# Patient Record
Sex: Female | Born: 1955
Health system: Southern US, Community
[De-identification: ages and names within clinical notes are randomized; demographics above are authoritative.]

## PROBLEM LIST (undated history)

## (undated) DIAGNOSIS — I208 Other forms of angina pectoris: Secondary | ICD-10-CM

## (undated) DIAGNOSIS — E7800 Pure hypercholesterolemia, unspecified: Secondary | ICD-10-CM

## (undated) DIAGNOSIS — M1990 Unspecified osteoarthritis, unspecified site: Secondary | ICD-10-CM

## (undated) DIAGNOSIS — I10 Essential (primary) hypertension: Secondary | ICD-10-CM

## (undated) DIAGNOSIS — E785 Hyperlipidemia, unspecified: Secondary | ICD-10-CM

## (undated) DIAGNOSIS — D649 Anemia, unspecified: Secondary | ICD-10-CM

## (undated) DIAGNOSIS — I2089 Other forms of angina pectoris: Secondary | ICD-10-CM

## (undated) DIAGNOSIS — M549 Dorsalgia, unspecified: Secondary | ICD-10-CM

## (undated) DIAGNOSIS — M4322 Fusion of spine, cervical region: Secondary | ICD-10-CM

## (undated) DIAGNOSIS — R7301 Impaired fasting glucose: Secondary | ICD-10-CM

## (undated) DIAGNOSIS — G5603 Carpal tunnel syndrome, bilateral upper limbs: Secondary | ICD-10-CM

## (undated) DIAGNOSIS — F419 Anxiety disorder, unspecified: Secondary | ICD-10-CM

## (undated) DIAGNOSIS — G8929 Other chronic pain: Secondary | ICD-10-CM

## (undated) DIAGNOSIS — M79606 Pain in leg, unspecified: Secondary | ICD-10-CM

## (undated) HISTORY — DX: Other chronic pain: G89.29

## (undated) HISTORY — DX: Carpal tunnel syndrome, bilateral upper limbs: G56.03

## (undated) HISTORY — DX: Pain in leg, unspecified: M79.606

## (undated) HISTORY — DX: Other forms of angina pectoris: I20.89

## (undated) HISTORY — DX: Anemia, unspecified: D64.9

## (undated) HISTORY — PX: CHOLECYSTECTOMY: SHX55

## (undated) HISTORY — DX: Hyperlipidemia, unspecified: E78.5

## (undated) HISTORY — DX: Essential (primary) hypertension: I10

## (undated) HISTORY — PX: ABDOMINAL HYSTERECTOMY: SHX81

## (undated) HISTORY — PX: COLONOSCOPY: SHX174

## (undated) HISTORY — DX: Fusion of spine, cervical region: M43.22

## (undated) HISTORY — DX: Impaired fasting glucose: R73.01

## (undated) HISTORY — DX: Pure hypercholesterolemia, unspecified: E78.00

## (undated) HISTORY — PX: CARDIAC CATHETERIZATION: SHX172

## (undated) HISTORY — PX: VEIN SURGERY: SHX48

## (undated) HISTORY — DX: Dorsalgia, unspecified: M54.9

## (undated) HISTORY — DX: Other forms of angina pectoris: I20.8

---

## 2001-06-01 ENCOUNTER — Other Ambulatory Visit: Admission: RE | Admit: 2001-06-01 | Discharge: 2001-06-01 | Payer: Self-pay | Admitting: General Surgery

## 2001-06-18 ENCOUNTER — Encounter: Payer: Self-pay | Admitting: *Deleted

## 2001-06-18 ENCOUNTER — Ambulatory Visit (HOSPITAL_COMMUNITY): Admission: RE | Admit: 2001-06-18 | Discharge: 2001-06-18 | Payer: Self-pay | Admitting: *Deleted

## 2001-06-22 ENCOUNTER — Ambulatory Visit (HOSPITAL_COMMUNITY): Admission: RE | Admit: 2001-06-22 | Discharge: 2001-06-22 | Payer: Self-pay | Admitting: Internal Medicine

## 2001-06-22 ENCOUNTER — Emergency Department (HOSPITAL_COMMUNITY): Admission: EM | Admit: 2001-06-22 | Discharge: 2001-06-22 | Payer: Self-pay | Admitting: Emergency Medicine

## 2001-06-22 ENCOUNTER — Encounter: Payer: Self-pay | Admitting: Emergency Medicine

## 2001-12-02 ENCOUNTER — Ambulatory Visit (HOSPITAL_COMMUNITY): Admission: RE | Admit: 2001-12-02 | Discharge: 2001-12-02 | Payer: Self-pay | Admitting: Pulmonary Disease

## 2002-03-23 ENCOUNTER — Encounter: Payer: Self-pay | Admitting: Obstetrics and Gynecology

## 2002-03-23 ENCOUNTER — Ambulatory Visit (HOSPITAL_COMMUNITY): Admission: RE | Admit: 2002-03-23 | Discharge: 2002-03-23 | Payer: Self-pay | Admitting: Obstetrics and Gynecology

## 2002-07-16 ENCOUNTER — Ambulatory Visit (HOSPITAL_COMMUNITY): Admission: RE | Admit: 2002-07-16 | Discharge: 2002-07-16 | Payer: Self-pay | Admitting: Pulmonary Disease

## 2003-01-24 ENCOUNTER — Ambulatory Visit (HOSPITAL_COMMUNITY): Admission: RE | Admit: 2003-01-24 | Discharge: 2003-01-24 | Payer: Self-pay

## 2003-01-24 ENCOUNTER — Encounter: Payer: Self-pay | Admitting: Obstetrics and Gynecology

## 2004-01-25 ENCOUNTER — Ambulatory Visit (HOSPITAL_COMMUNITY): Admission: RE | Admit: 2004-01-25 | Discharge: 2004-01-25 | Payer: Self-pay | Admitting: Family Medicine

## 2005-02-04 ENCOUNTER — Ambulatory Visit (HOSPITAL_COMMUNITY): Admission: RE | Admit: 2005-02-04 | Discharge: 2005-02-04 | Payer: Self-pay | Admitting: Obstetrics and Gynecology

## 2006-02-06 ENCOUNTER — Ambulatory Visit (HOSPITAL_COMMUNITY): Admission: RE | Admit: 2006-02-06 | Discharge: 2006-02-06 | Payer: Self-pay | Admitting: Obstetrics and Gynecology

## 2007-07-10 ENCOUNTER — Ambulatory Visit (HOSPITAL_COMMUNITY): Admission: RE | Admit: 2007-07-10 | Discharge: 2007-07-10 | Payer: Self-pay | Admitting: Obstetrics & Gynecology

## 2007-08-03 ENCOUNTER — Ambulatory Visit (HOSPITAL_COMMUNITY): Admission: RE | Admit: 2007-08-03 | Discharge: 2007-08-03 | Payer: Self-pay | Admitting: Family Medicine

## 2007-09-07 ENCOUNTER — Ambulatory Visit (HOSPITAL_COMMUNITY): Admission: RE | Admit: 2007-09-07 | Discharge: 2007-09-07 | Payer: Self-pay | Admitting: Gastroenterology

## 2007-09-07 ENCOUNTER — Encounter: Payer: Self-pay | Admitting: Gastroenterology

## 2007-09-07 ENCOUNTER — Ambulatory Visit: Payer: Self-pay | Admitting: Gastroenterology

## 2007-12-30 ENCOUNTER — Ambulatory Visit (HOSPITAL_COMMUNITY): Admission: RE | Admit: 2007-12-30 | Discharge: 2007-12-30 | Payer: Self-pay | Admitting: Obstetrics and Gynecology

## 2008-08-03 ENCOUNTER — Ambulatory Visit (HOSPITAL_COMMUNITY): Admission: RE | Admit: 2008-08-03 | Discharge: 2008-08-03 | Payer: Self-pay | Admitting: Obstetrics and Gynecology

## 2009-02-21 ENCOUNTER — Encounter (INDEPENDENT_AMBULATORY_CARE_PROVIDER_SITE_OTHER): Payer: Self-pay | Admitting: *Deleted

## 2009-03-20 DIAGNOSIS — I10 Essential (primary) hypertension: Secondary | ICD-10-CM | POA: Insufficient documentation

## 2009-03-20 DIAGNOSIS — Z9189 Other specified personal risk factors, not elsewhere classified: Secondary | ICD-10-CM | POA: Insufficient documentation

## 2009-03-21 ENCOUNTER — Ambulatory Visit: Payer: Self-pay | Admitting: Gastroenterology

## 2009-03-21 DIAGNOSIS — K3189 Other diseases of stomach and duodenum: Secondary | ICD-10-CM | POA: Insufficient documentation

## 2009-03-21 DIAGNOSIS — R1013 Epigastric pain: Secondary | ICD-10-CM

## 2009-03-22 ENCOUNTER — Encounter: Payer: Self-pay | Admitting: Gastroenterology

## 2009-03-23 ENCOUNTER — Encounter: Payer: Self-pay | Admitting: Gastroenterology

## 2009-04-11 ENCOUNTER — Encounter: Payer: Self-pay | Admitting: Gastroenterology

## 2009-04-11 ENCOUNTER — Ambulatory Visit: Payer: Self-pay | Admitting: Gastroenterology

## 2009-04-11 ENCOUNTER — Ambulatory Visit (HOSPITAL_COMMUNITY): Admission: RE | Admit: 2009-04-11 | Discharge: 2009-04-11 | Payer: Self-pay | Admitting: Gastroenterology

## 2009-04-13 ENCOUNTER — Ambulatory Visit: Payer: Self-pay | Admitting: Gastroenterology

## 2009-04-20 ENCOUNTER — Encounter: Payer: Self-pay | Admitting: Gastroenterology

## 2009-04-28 ENCOUNTER — Encounter: Payer: Self-pay | Admitting: Gastroenterology

## 2009-05-15 ENCOUNTER — Ambulatory Visit (HOSPITAL_COMMUNITY): Admission: RE | Admit: 2009-05-15 | Discharge: 2009-05-15 | Payer: Self-pay | Admitting: Gastroenterology

## 2009-07-12 ENCOUNTER — Ambulatory Visit: Payer: Self-pay | Admitting: Gastroenterology

## 2009-07-12 DIAGNOSIS — K2970 Gastritis, unspecified, without bleeding: Secondary | ICD-10-CM | POA: Insufficient documentation

## 2009-09-04 ENCOUNTER — Ambulatory Visit (HOSPITAL_COMMUNITY): Admission: RE | Admit: 2009-09-04 | Discharge: 2009-09-04 | Payer: Self-pay | Admitting: Family Medicine

## 2009-10-09 ENCOUNTER — Ambulatory Visit (HOSPITAL_COMMUNITY): Admission: RE | Admit: 2009-10-09 | Discharge: 2009-10-09 | Payer: Self-pay | Admitting: Obstetrics & Gynecology

## 2010-09-05 ENCOUNTER — Ambulatory Visit (HOSPITAL_COMMUNITY): Admission: RE | Admit: 2010-09-05 | Discharge: 2010-09-05 | Payer: Self-pay | Admitting: Obstetrics & Gynecology

## 2010-12-18 NOTE — Assessment & Plan Note (Signed)
Summary: PERSISTENT GASTRITIS/CM   Visit Type:  Follow-up Visit Primary Care Provider:  Loran Senters, M.D.  Chief Complaint:  persistent gastritis.  History of Present Illness: Choking mostly when she hasn't eaten anything. sx come and go. Feels full fast, and bloating. Sx worse firsth thing in the AM. Not a lot of heartburn. Sometimes indigestion. In the AM feels nauseous and no vomiting. Discomfort in chest and epigastrium. No radiation. Notices it when laying down and when she wakes up, <1x/wk. No weight loss, fever, or chills. Has hot flashes. BMs: 1-2x/day, nl ~diarrhea. Triggers: no. Cut out fats and feels better. Weight gain: 15 lbs. No blood in stool or tarry stools. Stopped Melxoicam and Nabumetone, but taking for heel spur. Sx did not changed after Abx. Discomfort since last Nov 2009. Protonix hasn't helped. No worse with wine.  Preventive Screening-Counseling & Management     Smoking Status: quit  Current Medications (verified): 1)  Moexpril/hctz 15/25 Mg .... Take 1 Tablet By Mouth Once A Day 2)  Protonix 40 Mg Tbec (Pantoprazole Sodium) .... Take 1 Tablet By Mouth Once A Day 3)  Citalopram Hydrobromide 20 Mg Tabs (Citalopram Hydrobromide) .... Take 1 Tablet By Mouth Once A Day  Allergies (verified): No Known Drug Allergies  Past History:  Past Medical History:    Hypertension    Hot flashes treated with Citaolpram    Hyperplastic polyps 2008  Past Surgical History:    Hysterectomy     Cholecystectomy: removed due to nausea    Cardiac catheterization  Family History:    No FH of Colon Cancer or polyps.  Social History:    Patient is a former smoker in her 64s.    Alcohol Use - yes, wine of weekends    Occupation: Works in [employer]    Daily Caffeine Use: chocolate, no sodas    Diet is low fat.    Smoking Status:  quit  Review of Systems       Sometimes SOB with episodes during the day. No PND, and can lay flat. ROS per HPI , otherwise all  systems negative.  Vital Signs:  Patient profile:   55 year old female Height:      63 inches Weight:      176.50 pounds BMI:     31.38 Temp:     98.7 degrees F oral Pulse rate:   64 / minute BP sitting:   130 / 78  (left arm) Cuff size:   regular  Vitals Entered By: Cloria Spring LPN (Mar 21, 1609 2:17 PM)  Physical Exam  General:  Well developed, well nourished, no acute distress. Head:  Normocephalic and atraumatic. Eyes:  PERRLA, no icterus. Mouth:  No deformity or lesions, dentition normal. Neck:  Supple; no masses lymphadenopathy. Lungs:  Clear throughout to auscultation. Heart:  Regular rate and rhythm; no murmurs, rubs,  or bruits. Abdomen:  Soft, nontender and nondistended. No masses, hepatosplenomegaly or hernias noted. Normal bowel sounds. Msk:  Symmetrical with no gross deformities. Normal posture. Extremities:  No edema or deformities noted. Neurologic:  Alert and  oriented x4;  grossly normal neurologically.  Impression & Recommendations:  Problem # 1:  DYSPEPSIA (ICD-536.8) Assessment New Likely secondary to GERD:  Differential includes NSAID gastritis, non-ulcer dyspepsia, doubt biliary colic or occult pancreatic malignancy. Pt has gained weight and is on a high fat diet. She loves [employer]. Omeprazole two times a day 30 minutes prior to meals. Low fat diet. HO given. Lose  10-20 lbs.  EGD in 3 weeks, UE:[id number]. If Sx not improved & no esophagitis or gastritis,  then will place a Bravo-48hrs on PPI two times a day.  RPV in 2 mos. Follow [facility] recs. Consider HFP/Lipase and CT A/P. Hold ASA. May use Nabumetone sparingly.  CC: Daisy Edwards, M.D. Prescriptions: EQ OMEPRAZOLE 20 MG TBEC (OMEPRAZOLE) 1po 30 minutes prior to breakfast and supper  #60 x 5   Entered and Authorized by:   Jacques Navy MD   Signed by:   Jacques Navy MD on 03/21/2009   Method used:   Electronically to        [facility] (retail)       [address]        [place], [place]  [address]       Ph: [phone]       Fax: [phone]   RxID:   [id number]   Appended Document: Orders Update-charge    Clinical Lists Changes  Orders: Added new Service order of Est. Patient Level V ([address]) - Signed

## 2010-12-18 NOTE — Letter (Signed)
Summary: PA & LAT CXR order  PA & LAT CXR order   Imported By: Minna Merritts 04/28/2009 16:31:52  _____________________________________________________________________  External Attachment:    Type:   Image     Comment:   External Document

## 2010-12-18 NOTE — Assessment & Plan Note (Signed)
Summary: fu ov 2 mo from procedure,dyspepsia/ams   Visit Type:  Follow-up Visit Primary Care Provider:  Lala Lund, M.D.  Chief Complaint:  follow up- doing good.  History of Present Illness: Pt doing well. Having CT and heel spur. Still has nabumetone and meloxicam and was taking them 3-4 times a week.   Current Medications (verified): 1)  Moexpril/hctz 15/25 Mg .... Take 1 Tablet By Mouth Once A Day 2)  Protonix 40 Mg Tbec (Pantoprazole Sodium) .... Take 1 Tablet By Mouth Once A Day 3)  Citalopram Hydrobromide 20 Mg Tabs (Citalopram Hydrobromide) .... Take 1 Tablet By Mouth Once A Day 4)  Eq Omeprazole 20 Mg Tbec (Omeprazole) .Marland Kitchen.. 1po 30 Minutes Prior To Breakfast and Supper  Allergies (verified): No Known Drug Allergies  Past History:  Past Medical History: EGD/NSAID Gastritis JUNE 2010 Hypertension Hot flashes treated with Citaolpram Hyperplastic polyps 2008  Vital Signs:  Patient profile:   55 year old female Height:      63 inches Weight:      178 pounds BMI:     31.65 Temp:     98.0 degrees F oral Pulse rate:   72 / minute BP sitting:   140 / 92  (left arm) Cuff size:   regular  Vitals Entered By: Hendricks Limes LPN (July 12, 2009 10:51 AM)  Physical Exam  General:  Well developed, well nourished, no acute distress. Head:  Normocephalic and atraumatic. Mouth:  No deformity or lesions, dentition normal. Lungs:  Clear throughout to auscultation. Heart:  Regular rate and rhythm; no murmurs, rubs,  or bruits. Abdomen:  Soft, nontender and nondistended. Normal bowel sounds.  Impression & Recommendations:  Problem # 1:  GASTRITIS, ACUTE W/O HEMORRHAGE (ICD-535.00) Ok to take Nabumetone. Avoid using Meloxicam and Nabumetone together. Explained risk to GI and GU tract. Continue omeprazole two times a day. OPV in 4 mos. May try reducing omperazole to once a day after next visit.  CC: Dr. Lala Lund  Appended Document: Orders Update-charge    Clinical Lists  Changes  Orders: Added new Service order of Est. Patient Level II ([address]) - Signed

## 2010-12-18 NOTE — Letter (Signed)
Summary: EGD Order  EGD Order   Imported By: Elinor Parkinson 03/22/2009 12:21:59  _____________________________________________________________________  External Attachment:    Type:   Image     Comment:   External Document

## 2010-12-18 NOTE — Medication Information (Signed)
Summary: omeprazole PA  omeprazole PA   Imported By: Hendricks Limes LPN 16/08/9603 54:09:81  _____________________________________________________________________  External Attachment:    Type:   Image     Comment:   External Document

## 2010-12-18 NOTE — Letter (Signed)
Summary: Generic Letter, Intro to Referring  [facility]  [address]   [place], [place] [address]   Phone: [phone]  Fax: [phone]      February 21, 2009             RE: Daisy Edwards   10-22-1956                 [address]                 [place], [place]  [address]  Dear, Appointment Secretary   Patient is scheduled on 03/21/09 at 2:00pm with Dr. Cira Servant.            Sincerely,    Ave Filter  [facility] Ph: [phone]   Fax: [phone]

## 2011-04-02 NOTE — Op Note (Signed)
NAME:  Daisy Edwards, Daisy Edwards               ACCOUNT NO.:  [id number]   MEDICAL RECORD NO.:  [id number]          PATIENT TYPE:  AMB   LOCATION:  DAY                           FACILITY:  [facility]   PHYSICIAN:  Kassie Mends, M.D.      DATE OF BIRTH:  01/04/1956   DATE OF PROCEDURE:  04/11/2009  DATE OF DISCHARGE:                               OPERATIVE REPORT   PROCEDURE:  Esophagogastroduodenoscopy with cold forceps biopsy and  Bravo capsule placement.   INDICATION FOR EXAM:  Ms. Nappi is a 55 year old female, who presents  with new-onset dyspepsia.  Dyspepsia did not respond to Protonix once  daily or omeprazole twice a day.  Her symptoms persist.  She does drink  wine on the weekend.  She was using meloxicam and nabumetone and now she  is using it sparingly.   FINDINGS:  1. Normal esophagus without evidence of Barrett mass, erosions, or      ulcerations.  2. Patchy erythema in the antrum without evidence of erosion or      ulceration.  Biopsies obtained via cold forceps to evaluate for H.      pylori gastritis in etiology for her dyspepsia.  3. A 1-2 cm hiatal hernia.  4. Normal duodenal bulb and second portion of the duodenum with      moderate bile staining.   DIAGNOSES:  1. Small hiatal hernia.  2. Mild gastritis, no source for dyspepsia identified.   RECOMMENDATIONS:  1. She will have 48-hour Bravo study on omeprazole twice daily.  2. Will await the results of her biopsies.  If neither evaluation      reveals an etiology for her dyspepsia, she will need a hepatic      function panel, lipase, and CT scan of the abdomen and pelvis.  3. No aspirin or NSAIDs or anticoagulation for 5 days.  She is given      information on gastritis.  She should follow a high-fiber diet.  4. She already has a followup appointment to see me in 2 months.   MEDICATIONS:  1. Demerol 50 mg IV.  2. Versed 4 mg IV.   PROCEDURE TECHNIQUE:  Physical exam was performed.  Informed consent was  obtained.  The  patient was explained the benefits, risks, and  alternatives to the procedure.  The patient was connected to monitor and  placed in left lateral position.  Continuous oxygen was provided by  nasal cannula.  IV medicine administered through an indwelling cannula.  After administration of sedation, the patient's esophagus was intubated.  The scope was advanced under direct visualization to the second portion  of the duodenum.  The scope was removed slowly by carefully examining  color, texture, anatomy, and integrity of mucosa on the way out.  Prior  to withdrawal, the scope at the GE junction was identified at 35 cm from  the teeth.  The initial Bravo capsule was introduced to 29 cm from the  teeth.  Suction was applied for 1 minute.  The patient's esophagus was  intubated with a diagnostic gastroscope and visualization of the distal  esophagus revealed the capsule had not attached.  The capsule was  retrieved via [employer].  A second Bravo capsule was deployed at 29 cm  from the teeth.  Suction was applied for 2 minutes.  The patient's  esophagus was intubated with a diagnostic gastroscope and the scope was  advanced to the distal esophagus where confirmation of placement on the  sidewall of the esophagus was achieved.  The scope and the introducer  were removed.  The patient was recovered in endoscopy and discharged  home in satisfactory condition.   PATH:  Gastritis      Kassie Mends, M.D.  Electronically Signed     SM/MEDQ  D:  04/11/2009  T:  04/12/2009  Job:  [id number]   cc:   Donna Bernard, M.D.  Fax: [phone]

## 2011-04-02 NOTE — Op Note (Signed)
NAME:  Daisy Edwards, Daisy Edwards               ACCOUNT NO.:  [id number]   MEDICAL RECORD NO.:  [id number]          PATIENT TYPE:  AMB   LOCATION:  DAY                           FACILITY:  [facility]   PHYSICIAN:  Kassie Mends, M.D.      DATE OF BIRTH:  1956/06/01   DATE OF PROCEDURE:  09/07/2007  DATE OF DISCHARGE:                               OPERATIVE REPORT   PROCEDURE:  Colonoscopy with cold forceps polypectomy.   REFERRING Pearl Bents:  Cyril Mourning, NP   INDICATION FOR EXAM:  Daisy Edwards is a 55 year old female who presents for  average risk colon cancer screening.   FINDINGS:  1. Two 3-5 mm sessile rectal polyps removed via cold forceps.      Otherwise rare sigmoid diverticulosis.  2. No masses, inflammatory changes or AVMs seen.  3. Normal retroflexed view of the rectum.   RECOMMENDATIONS:  1. Will call Daisy Edwards with results of her polypectomy.  If her polyp      is adenomatous, then she should have screening colonoscopy in 5      years.  Her first-degree relatives should have screening      colonoscopy age 59 and then every 5 years.  2. She should follow high fiber diet.  She is given handout on high-      fiber diet polyps and diverticulosis.  3. No aspirin or anti-inflammatory drugs for five days.  No      anticoagulation for five days.   MEDICATIONS:  1. Demerol 75 mg IV.  2. Versed 6 mg IV.   PROCEDURE TECHNIQUE:  Physical exam was performed.  Informed consent was  obtained from the patient after explaining benefits, risks and  alternatives to the procedure.  The patient was connected to monitor and  placed in left lateral position.  Continuous oxygen was provided by  nasal cannula and IV medicine administered through an indwelling  cannula.  After administration of sedation and rectal exam, the  patient's rectum was intubated and the  scope was advanced under direct visualization to the cecum.  The scope  was removed slowly by carefully examining the color, texture, anatomy  and integrity of the mucosa on the way out.  The patient was recovered  in endoscopy and discharged home in satisfactory condition.      Kassie Mends, M.D.  Electronically Signed     SM/MEDQ  D:  09/07/2007  T:  09/08/2007  Job:  [id number]   cc:   Lazaro Arms, M.D.  Fax: [phone]

## 2011-04-02 NOTE — Procedures (Signed)
NAME:  Daisy Edwards, Daisy Edwards               ACCOUNT NO.:  [id number]   MEDICAL RECORD NO.:  [id number]          PATIENT TYPE:  OUT   LOCATION:  DFTL                          FACILITY:  [facility]   PHYSICIAN:  Donna Bernard, M.D.DATE OF BIRTH:  02-11-1956   DATE OF PROCEDURE:  08/03/2007  DATE OF DISCHARGE:  08/03/2007                                  STRESS TEST   INDICATION:  This patient is a 55 year old female with a history of  hypertension and some atypical symptoms in the chest.  The test is being  performed for stratification of risk of cardiovascular disease.   Stress test was performed at standard Bruce protocol.  Resting EKG  revealed normal sinus rhythm with no significant ST-T changes.  The  patient tolerated the first two stages well.  Her sub max predicted  heart rate was 145.  The patient achieved this rate at a minute and a  half into the third stage.  At this point at 0.08 seconds past the J-  point, the patient had minimal ST-segment depression with a sharply  ascending slope.  Reading of this was somewhat compromised by artifact.  It should be noted, though, that in the rest phase we received a good  EKG which showed no significant ST-segment changes.   IMPRESSION:  Negative adequate stress test.   PLAN:  Patient encouraged to exercise regularly, stick with medications  and follow up in office as needed for further concerns.      Donna Bernard, M.D.  Electronically Signed     WSL/MEDQ  D:  08/31/2007  T:  09/01/2007  Job:  [id number]

## 2011-04-05 NOTE — Op Note (Signed)
NAME:  Daisy Edwards, Daisy Edwards               ACCOUNT NO.:  [id number]   MEDICAL RECORD NO.:  [id number]          PATIENT TYPE:  AMB   LOCATION:  DAY                           FACILITY:  [facility]   PHYSICIAN:  Kassie Mends, M.D.      DATE OF BIRTH:  14-Feb-1956   DATE OF PROCEDURE:  04/13/2009  DATE OF DISCHARGE:  04/11/2009                               OPERATIVE REPORT   PROCEDURE:  48-hour Bravo capsule study.   REFERRING Tymothy Cass:  Donna Bernard, M.D.   INDICATION FOR EXAM:  [name] is a 55 year old female who presented  with choking when she has not eaten anything.  She had early satiety and  bloating.  Tums was not helping.  She had an upper endoscopy which  showed a normal esophagus and some patchy erythema.  Her biopsy showed  moderate gastritis without evidence of H pylori.  Her gastritis is  likely secondary to meloxicam and nabumetone.  A Bravo study was placed  to evaluate for uncontrolled gastroesophageal reflux disease on  omeprazole twice a day.   FINDINGS:  The patient had a 48-hour study.  Day 1, the evaluation was  22 hours and 33 minutes.  Day 2, the evaluation was 23 hours and 7  minutes.  She had DeMeester score on day 1 of 0.3 and a DeMeester score  on day 2 of 1.8.  She had 104 episodes of reflux.  Five lasted greater  than 5 minutes.  The duration of the longest reflux was 16 minutes.  She  spent 4.9 minutes at a pH less than 4 in the 48-hour period.  Her  symptom association probability was 60.4% heartburn, 7.2% meal, 30.2%  chest pain, and 99.1% other.  A symptom association probability of  greater than 90% indicates the probability that the observed association  occurred by chance is less than 5%.  She submitted a diary and on day 1  she had episodes of chest pain, 1 episode of chest pain at 2:30 p.m., 1  episode of chest pain at 11:30 p.m., 1 episode of symptoms at 5 o'clock  p.m., and 1 episode which occurred during sleep.  On day 2, she had 2  episodes  associated with heartburn, 2 episodes associated with chest  pain, 3 episodes associated with meal, and 1episode associated with  sleep.   The fraction of time that the pH was less than 4 was also measured in  the upright position at 7.93 minutes for 48 hours and 1.4 minutes in the  supine position for 48 hours.   DIAGNOSIS:  [name] had several episodes of reflux during the 48-hour  period of time.  It does not appear that she has acid reflux.  She  likely has nonacid reflux.   RECOMMENDATIONS:  She should consider the initiation of baclofen or  continued BID PPI/diet modification. She has no clinical indication for  a Nissen fundoplication.      Kassie Mends, M.D.  Electronically Signed     SM/MEDQ  D:  04/19/2009  T:  04/19/2009  Job:  [id number]   cc:  Margaretmary Eddy, M.D.  Fax: [phone]

## 2011-04-18 ENCOUNTER — Ambulatory Visit (HOSPITAL_COMMUNITY)
Admission: RE | Admit: 2011-04-18 | Discharge: 2011-04-18 | Disposition: A | Discharge status: Home / Self Care | Payer: BC Managed Care – PPO | Source: Ambulatory Visit | Attending: Family Medicine | Admitting: Family Medicine

## 2011-04-18 ENCOUNTER — Other Ambulatory Visit: Payer: Self-pay | Admitting: Family Medicine

## 2011-04-18 DIAGNOSIS — M25519 Pain in unspecified shoulder: Secondary | ICD-10-CM | POA: Insufficient documentation

## 2011-04-18 DIAGNOSIS — R52 Pain, unspecified: Secondary | ICD-10-CM

## 2012-01-03 ENCOUNTER — Other Ambulatory Visit: Payer: Self-pay | Admitting: Obstetrics and Gynecology

## 2012-01-03 DIAGNOSIS — Z139 Encounter for screening, unspecified: Secondary | ICD-10-CM

## 2012-01-09 ENCOUNTER — Ambulatory Visit (HOSPITAL_COMMUNITY)
Admission: RE | Admit: 2012-01-09 | Discharge: 2012-01-09 | Disposition: A | Discharge status: Home / Self Care | Payer: Managed Care, Other (non HMO) | Source: Ambulatory Visit | Attending: Obstetrics and Gynecology | Admitting: Obstetrics and Gynecology

## 2012-01-09 DIAGNOSIS — Z139 Encounter for screening, unspecified: Secondary | ICD-10-CM

## 2012-01-09 DIAGNOSIS — Z1231 Encounter for screening mammogram for malignant neoplasm of breast: Secondary | ICD-10-CM | POA: Insufficient documentation

## 2012-01-23 ENCOUNTER — Encounter (HOSPITAL_COMMUNITY): Payer: Self-pay

## 2012-01-24 ENCOUNTER — Other Ambulatory Visit: Payer: Self-pay

## 2012-01-24 ENCOUNTER — Ambulatory Visit (HOSPITAL_COMMUNITY)
Admission: RE | Admit: 2012-01-24 | Discharge: 2012-01-24 | Disposition: A | Discharge status: Home / Self Care | Payer: Managed Care, Other (non HMO) | Source: Ambulatory Visit | Attending: Orthopaedic Surgery | Admitting: Orthopaedic Surgery

## 2012-01-24 ENCOUNTER — Encounter (HOSPITAL_COMMUNITY): Payer: Self-pay

## 2012-01-24 ENCOUNTER — Encounter (HOSPITAL_COMMUNITY)
Admission: RE | Admit: 2012-01-24 | Discharge: 2012-01-24 | Disposition: A | Discharge status: Home / Self Care | Payer: Managed Care, Other (non HMO) | Source: Ambulatory Visit | Attending: Orthopaedic Surgery | Admitting: Orthopaedic Surgery

## 2012-01-24 ENCOUNTER — Other Ambulatory Visit (HOSPITAL_COMMUNITY): Payer: Self-pay | Admitting: Orthopaedic Surgery

## 2012-01-24 DIAGNOSIS — Z01810 Encounter for preprocedural cardiovascular examination: Secondary | ICD-10-CM | POA: Insufficient documentation

## 2012-01-24 DIAGNOSIS — Z01811 Encounter for preprocedural respiratory examination: Secondary | ICD-10-CM | POA: Insufficient documentation

## 2012-01-24 DIAGNOSIS — Z01818 Encounter for other preprocedural examination: Secondary | ICD-10-CM | POA: Insufficient documentation

## 2012-01-24 DIAGNOSIS — Z01812 Encounter for preprocedural laboratory examination: Secondary | ICD-10-CM | POA: Insufficient documentation

## 2012-01-24 HISTORY — DX: Anxiety disorder, unspecified: F41.9

## 2012-01-24 HISTORY — DX: Unspecified osteoarthritis, unspecified site: M19.90

## 2012-01-24 LAB — URINALYSIS, ROUTINE W REFLEX MICROSCOPIC
Bilirubin Urine: NEGATIVE
Glucose, UA: NEGATIVE mg/dL
Hgb urine dipstick: NEGATIVE
Ketones, ur: NEGATIVE mg/dL
Leukocytes, UA: NEGATIVE
Nitrite: NEGATIVE
Protein, ur: NEGATIVE mg/dL
Specific Gravity, Urine: 1.011 (ref 1.005–1.030)
Urobilinogen, UA: 0.2 mg/dL (ref 0.0–1.0)
pH: 6.5 (ref 5.0–8.0)

## 2012-01-24 LAB — COMPREHENSIVE METABOLIC PANEL
ALT: 27 U/L (ref 0–35)
AST: 22 U/L (ref 0–37)
Albumin: 4.2 g/dL (ref 3.5–5.2)
Alkaline Phosphatase: 75 U/L (ref 39–117)
BUN: 12 mg/dL (ref 6–23)
CO2: 30 mEq/L (ref 19–32)
Calcium: 10.1 mg/dL (ref 8.4–10.5)
Chloride: 101 mEq/L (ref 96–112)
Creatinine, Ser: 0.73 mg/dL (ref 0.50–1.10)
GFR calc Af Amer: >90 mL/min (ref >90)
GFR calc non Af Amer: >90 mL/min (ref >90)
Glucose, Bld: 87 mg/dL (ref 70–99)
Potassium: 3.8 mEq/L (ref 3.5–5.1)
Sodium: 140 mEq/L (ref 135–145)
Total Bilirubin: 0.4 mg/dL (ref 0.3–1.2)
Total Protein: 7.3 g/dL (ref 6.0–8.3)

## 2012-01-24 LAB — CBC
HCT: 40.5 % (ref 36.0–46.0)
Hemoglobin: 13.6 g/dL (ref 12.0–15.0)
MCH: 26.7 pg (ref 26.0–34.0)
MCHC: 33.6 g/dL (ref 30.0–36.0)
MCV: 79.6 fL (ref 78.0–100.0)
Platelets: 257 10*3/uL (ref 150–400)
RBC: 5.09 MIL/uL (ref 3.87–5.11)
RDW: 13.1 % (ref 11.5–15.5)
WBC: 5.7 10*3/uL (ref 4.0–10.5)

## 2012-01-24 LAB — PROTIME-INR
INR: 1.05 (ref 0.00–1.49)
Prothrombin Time: 13.9 seconds (ref 11.6–15.2)

## 2012-01-24 LAB — SURGICAL PCR SCREEN
MRSA, PCR: NEGATIVE
Staphylococcus aureus: NEGATIVE

## 2012-01-24 LAB — APTT: aPTT: 29 seconds (ref 24–37)

## 2012-01-24 NOTE — Pre-Procedure Instructions (Signed)
20 AHMARI GARTON  01/24/2012   Your procedure is scheduled on: 01/29/2012  Report to [facility] at 10:30 AM.  Call this number if you have problems the morning of surgery: [phone]   Remember:   Do not eat food:After Midnight.  May have clear liquids: up to 4 Hours before arrival.  Clear liquids include soda, tea, black coffee, apple or grape juice, broth.  Take these medicines the morning of surgery with A SIP OF WATER: NONE   Do not wear jewelry, make-up or nail polish.  Do not wear lotions, powders, or perfumes. You may wear deodorant.  Do not shave 48 hours prior to surgery.  Do not bring valuables to the hospital.  Contacts, dentures or bridgework may not be worn into surgery.  Leave suitcase in the car. After surgery it may be brought to your room.  For patients admitted to the hospital, checkout time is 11:00 AM the day of discharge.   Patients discharged the day of surgery will not be allowed to drive home.  Name and phone number of your driver: with spouse  Special Instructions: CHG Shower Use Special Wash: 1/2 bottle night before surgery and 1/2 bottle morning of surgery.   Please read over the following fact sheets that you were given: Pain Booklet, Coughing and Deep Breathing, MRSA Information and Surgical Site Infection Prevention

## 2012-01-24 NOTE — Progress Notes (Signed)
Call to [facility], request made for EKG done with 2008 stress test., left voicemail at Med. Records

## 2012-01-27 NOTE — Consult Note (Signed)
Anesthesia:  Patient is a 56 year old female scheduled for a C3-4, C4-5 ACDF on 01/29/12.  History includes HTN, anxiety, arthritis, non-smoker.  Reportedly she had a normal cardiac cath over five years ago (she was unsure of where it was done) and a negative exercise stress test on 08/03/07 by Dr. Lilyan Punt.  EKG on 01/24/12 showed SB at 57 bpm.    Preoperative labs and CXR are WNL.  Plan to proceed.

## 2012-01-28 MED ORDER — CEFAZOLIN SODIUM-DEXTROSE 2-3 GM-% IV SOLR
2.0000 g | Freq: Once | INTRAVENOUS | Status: AC
  Administered 2012-01-29: 2 g via INTRAVENOUS
  Filled 2012-01-28: qty 50

## 2012-01-29 ENCOUNTER — Ambulatory Visit (HOSPITAL_COMMUNITY): Payer: Worker's Compensation | Admitting: Vascular Surgery

## 2012-01-29 ENCOUNTER — Encounter (HOSPITAL_COMMUNITY): Payer: Self-pay | Admitting: Vascular Surgery

## 2012-01-29 ENCOUNTER — Encounter (HOSPITAL_COMMUNITY)
Admission: RE | Disposition: A | Discharge status: Home / Self Care | Payer: Self-pay | Source: Ambulatory Visit | Attending: Orthopaedic Surgery

## 2012-01-29 ENCOUNTER — Encounter (HOSPITAL_COMMUNITY): Payer: Self-pay | Admitting: Anesthesiology

## 2012-01-29 ENCOUNTER — Ambulatory Visit (HOSPITAL_COMMUNITY): Payer: Worker's Compensation

## 2012-01-29 ENCOUNTER — Encounter (HOSPITAL_COMMUNITY): Payer: Self-pay | Admitting: Surgery

## 2012-01-29 ENCOUNTER — Ambulatory Visit (HOSPITAL_COMMUNITY)
Admission: RE | Admit: 2012-01-29 | Discharge: 2012-01-30 | Disposition: A | Discharge status: Home / Self Care | Payer: Worker's Compensation | Source: Ambulatory Visit | Attending: Orthopaedic Surgery | Admitting: Orthopaedic Surgery

## 2012-01-29 DIAGNOSIS — IMO0001 Reserved for inherently not codable concepts without codable children: Secondary | ICD-10-CM | POA: Insufficient documentation

## 2012-01-29 DIAGNOSIS — F411 Generalized anxiety disorder: Secondary | ICD-10-CM | POA: Insufficient documentation

## 2012-01-29 DIAGNOSIS — M47812 Spondylosis without myelopathy or radiculopathy, cervical region: Secondary | ICD-10-CM | POA: Insufficient documentation

## 2012-01-29 DIAGNOSIS — IMO0002 Reserved for concepts with insufficient information to code with codable children: Secondary | ICD-10-CM | POA: Insufficient documentation

## 2012-01-29 DIAGNOSIS — M5020 Other cervical disc displacement, unspecified cervical region: Secondary | ICD-10-CM | POA: Insufficient documentation

## 2012-01-29 DIAGNOSIS — M1990 Unspecified osteoarthritis, unspecified site: Secondary | ICD-10-CM | POA: Insufficient documentation

## 2012-01-29 DIAGNOSIS — I10 Essential (primary) hypertension: Secondary | ICD-10-CM | POA: Insufficient documentation

## 2012-01-29 DIAGNOSIS — M4802 Spinal stenosis, cervical region: Secondary | ICD-10-CM | POA: Diagnosis present

## 2012-01-29 HISTORY — PX: ANTERIOR CERVICAL DECOMP/DISCECTOMY FUSION: SHX1161

## 2012-01-29 SURGERY — ANTERIOR CERVICAL DECOMPRESSION/DISCECTOMY FUSION 2 LEVELS
Anesthesia: General | Site: Neck | Wound class: Clean

## 2012-01-29 MED ORDER — KCL IN DEXTROSE-NACL 20-5-0.45 MEQ/L-%-% IV SOLN
INTRAVENOUS | Status: DC
  Administered 2012-01-29 – 2012-01-30 (×2): via INTRAVENOUS
  Filled 2012-01-29 (×3): qty 1000

## 2012-01-29 MED ORDER — FENTANYL CITRATE 0.05 MG/ML IJ SOLN
INTRAMUSCULAR | Status: DC | PRN
  Administered 2012-01-29 (×3): 50 ug via INTRAVENOUS
  Administered 2012-01-29: 150 ug via INTRAVENOUS

## 2012-01-29 MED ORDER — MIDAZOLAM HCL 5 MG/5ML IJ SOLN
INTRAMUSCULAR | Status: DC | PRN
  Administered 2012-01-29: 2 mg via INTRAVENOUS

## 2012-01-29 MED ORDER — ZOLPIDEM TARTRATE 10 MG PO TABS: 10.0000 mg | ORAL_TABLET | Freq: Every evening | ORAL | Status: DC | PRN

## 2012-01-29 MED ORDER — CEFAZOLIN SODIUM 1-5 GM-% IV SOLN
1.0000 g | Freq: Three times a day (TID) | INTRAVENOUS | Status: AC
  Administered 2012-01-29 – 2012-01-30 (×2): 1 g via INTRAVENOUS
  Filled 2012-01-29 (×3): qty 50

## 2012-01-29 MED ORDER — GLYCOPYRROLATE 0.2 MG/ML IJ SOLN
INTRAMUSCULAR | Status: DC | PRN
  Administered 2012-01-29: 0.2 mg via INTRAVENOUS

## 2012-01-29 MED ORDER — ROCURONIUM BROMIDE 100 MG/10ML IV SOLN
INTRAVENOUS | Status: DC | PRN
  Administered 2012-01-29: 50 mg via INTRAVENOUS

## 2012-01-29 MED ORDER — SIMVASTATIN 10 MG PO TABS
10.0000 mg | ORAL_TABLET | Freq: Every day | ORAL | Status: DC
  Filled 2012-01-29: qty 1

## 2012-01-29 MED ORDER — PROMETHAZINE HCL 25 MG/ML IJ SOLN
INTRAMUSCULAR | Status: AC
  Administered 2012-01-29: 6.25 mg via INTRAVENOUS
  Filled 2012-01-29: qty 1

## 2012-01-29 MED ORDER — SENNOSIDES-DOCUSATE SODIUM 8.6-50 MG PO TABS: 1.0000 | ORAL_TABLET | Freq: Every evening | ORAL | Status: DC | PRN

## 2012-01-29 MED ORDER — PROMETHAZINE HCL 25 MG/ML IJ SOLN
6.2500 mg | Freq: Once | INTRAMUSCULAR | Status: AC
  Administered 2012-01-29: 6.25 mg via INTRAVENOUS

## 2012-01-29 MED ORDER — MOEXIPRIL-HYDROCHLOROTHIAZIDE 15-25 MG PO TABS: 1.0000 | ORAL_TABLET | ORAL | Status: DC

## 2012-01-29 MED ORDER — KETOROLAC TROMETHAMINE 30 MG/ML IJ SOLN
INTRAMUSCULAR | Status: AC
  Filled 2012-01-29: qty 1

## 2012-01-29 MED ORDER — DOCUSATE SODIUM 100 MG PO CAPS
100.0000 mg | ORAL_CAPSULE | Freq: Two times a day (BID) | ORAL | Status: DC
  Administered 2012-01-29 – 2012-01-30 (×2): 100 mg via ORAL
  Filled 2012-01-29 (×3): qty 1

## 2012-01-29 MED ORDER — ACETAMINOPHEN 10 MG/ML IV SOLN
INTRAVENOUS | Status: DC | PRN
  Administered 2012-01-29: 1000 mg via INTRAVENOUS

## 2012-01-29 MED ORDER — LACTATED RINGERS IV SOLN
INTRAVENOUS | Status: DC
  Administered 2012-01-29: 12:00:00 via INTRAVENOUS

## 2012-01-29 MED ORDER — LACTATED RINGERS IV SOLN
INTRAVENOUS | Status: DC | PRN
  Administered 2012-01-29 (×2): via INTRAVENOUS

## 2012-01-29 MED ORDER — NEOSTIGMINE METHYLSULFATE 1 MG/ML IJ SOLN
INTRAMUSCULAR | Status: DC | PRN
  Administered 2012-01-29: 2 mg via INTRAVENOUS

## 2012-01-29 MED ORDER — KCL IN DEXTROSE-NACL 20-5-0.45 MEQ/L-%-% IV SOLN
INTRAVENOUS | Status: AC
  Filled 2012-01-29: qty 1000

## 2012-01-29 MED ORDER — MENTHOL 3 MG MT LOZG: 1.0000 | LOZENGE | OROMUCOSAL | Status: DC | PRN

## 2012-01-29 MED ORDER — LIDOCAINE HCL 4 % MT SOLN
OROMUCOSAL | Status: DC | PRN
  Administered 2012-01-29: 4 mL via TOPICAL

## 2012-01-29 MED ORDER — ACETAMINOPHEN 650 MG RE SUPP: 650.0000 mg | RECTAL | Status: DC | PRN

## 2012-01-29 MED ORDER — BISACODYL 10 MG RE SUPP: 10.0000 mg | Freq: Every day | RECTAL | Status: DC | PRN

## 2012-01-29 MED ORDER — SODIUM CHLORIDE 0.9 % IJ SOLN: 3.0000 mL | INTRAMUSCULAR | Status: DC | PRN

## 2012-01-29 MED ORDER — ONDANSETRON HCL 4 MG/2ML IJ SOLN
4.0000 mg | Freq: Once | INTRAMUSCULAR | Status: AC | PRN
  Administered 2012-01-29: 4 mg via INTRAVENOUS

## 2012-01-29 MED ORDER — DEXAMETHASONE SODIUM PHOSPHATE 4 MG/ML IJ SOLN
INTRAMUSCULAR | Status: DC | PRN
  Administered 2012-01-29: 4 mg via INTRAVENOUS

## 2012-01-29 MED ORDER — THROMBIN 20000 UNITS EX KIT
PACK | CUTANEOUS | Status: DC | PRN
  Administered 2012-01-29: 14:00:00 via TOPICAL

## 2012-01-29 MED ORDER — ADULT MULTIVITAMIN W/MINERALS CH
1.0000 | ORAL_TABLET | Freq: Every day | ORAL | Status: DC
  Filled 2012-01-29: qty 1

## 2012-01-29 MED ORDER — KETOROLAC TROMETHAMINE 30 MG/ML IJ SOLN
30.0000 mg | Freq: Once | INTRAMUSCULAR | Status: AC
  Administered 2012-01-29: 30 mg via INTRAVENOUS

## 2012-01-29 MED ORDER — PROPOFOL 10 MG/ML IV EMUL
INTRAVENOUS | Status: DC | PRN
  Administered 2012-01-29: 50 mg via INTRAVENOUS
  Administered 2012-01-29: 200 mg via INTRAVENOUS

## 2012-01-29 MED ORDER — HYDROMORPHONE HCL PF 1 MG/ML IJ SOLN
0.2500 mg | INTRAMUSCULAR | Status: DC | PRN
  Administered 2012-01-29 (×2): 0.25 mg via INTRAVENOUS

## 2012-01-29 MED ORDER — ARTIFICIAL TEARS OP OINT
TOPICAL_OINTMENT | OPHTHALMIC | Status: DC | PRN
  Administered 2012-01-29: 1 via OPHTHALMIC

## 2012-01-29 MED ORDER — METHOCARBAMOL 500 MG PO TABS: 500.0000 mg | ORAL_TABLET | Freq: Four times a day (QID) | ORAL | Status: DC | PRN

## 2012-01-29 MED ORDER — PHENOL 1.4 % MT LIQD
1.0000 | OROMUCOSAL | Status: DC | PRN
  Filled 2012-01-29: qty 177

## 2012-01-29 MED ORDER — TRANDOLAPRIL 2 MG PO TABS
2.0000 mg | ORAL_TABLET | Freq: Every day | ORAL | Status: DC
  Administered 2012-01-29 – 2012-01-30 (×2): 2 mg via ORAL
  Filled 2012-01-29 (×4): qty 1

## 2012-01-29 MED ORDER — OXYCODONE-ACETAMINOPHEN 5-325 MG PO TABS: 1.0000 | ORAL_TABLET | ORAL | Status: DC | PRN

## 2012-01-29 MED ORDER — MORPHINE SULFATE 4 MG/ML IJ SOLN: 1.0000 mg | INTRAMUSCULAR | Status: DC | PRN

## 2012-01-29 MED ORDER — ONDANSETRON HCL 4 MG/2ML IJ SOLN: 4.0000 mg | INTRAMUSCULAR | Status: DC | PRN

## 2012-01-29 MED ORDER — HYDROCHLOROTHIAZIDE 25 MG PO TABS
25.0000 mg | ORAL_TABLET | Freq: Every day | ORAL | Status: DC
  Administered 2012-01-29 – 2012-01-30 (×2): 25 mg via ORAL
  Filled 2012-01-29 (×2): qty 1

## 2012-01-29 MED ORDER — HYDROCODONE-ACETAMINOPHEN 5-325 MG PO TABS
1.0000 | ORAL_TABLET | ORAL | Status: DC | PRN
  Administered 2012-01-30: 1 via ORAL
  Filled 2012-01-29: qty 1

## 2012-01-29 MED ORDER — ACETAMINOPHEN 325 MG PO TABS: 650.0000 mg | ORAL_TABLET | ORAL | Status: DC | PRN

## 2012-01-29 MED ORDER — SODIUM CHLORIDE 0.9 % IV SOLN: 250.0000 mL | INTRAVENOUS | Status: DC

## 2012-01-29 MED ORDER — SODIUM CHLORIDE 0.9 % IJ SOLN
3.0000 mL | Freq: Two times a day (BID) | INTRAMUSCULAR | Status: DC
  Administered 2012-01-29: 3 mL via INTRAVENOUS

## 2012-01-29 MED ORDER — EPHEDRINE SULFATE 50 MG/ML IJ SOLN
INTRAMUSCULAR | Status: DC | PRN
  Administered 2012-01-29: 10 mg via INTRAVENOUS
  Administered 2012-01-29: 5 mg via INTRAVENOUS
  Administered 2012-01-29: 10 mg via INTRAVENOUS
  Administered 2012-01-29: 5 mg via INTRAVENOUS
  Administered 2012-01-29 (×2): 10 mg via INTRAVENOUS

## 2012-01-29 MED ORDER — 0.9 % SODIUM CHLORIDE (POUR BTL) OPTIME
TOPICAL | Status: DC | PRN
  Administered 2012-01-29: 1000 mL

## 2012-01-29 MED ORDER — ACETAMINOPHEN 10 MG/ML IV SOLN
INTRAVENOUS | Status: AC
  Filled 2012-01-29: qty 100

## 2012-01-29 MED ORDER — ONDANSETRON HCL 4 MG/2ML IJ SOLN
INTRAMUSCULAR | Status: DC | PRN
  Administered 2012-01-29: 4 mg via INTRAVENOUS

## 2012-01-29 MED ORDER — HYDROMORPHONE HCL PF 1 MG/ML IJ SOLN
INTRAMUSCULAR | Status: AC
  Administered 2012-01-29: 0.25 mg via INTRAVENOUS
  Filled 2012-01-29: qty 1

## 2012-01-29 MED ORDER — BUPIVACAINE-EPINEPHRINE 0.25% -1:200000 IJ SOLN
INTRAMUSCULAR | Status: DC | PRN
  Administered 2012-01-29: 6 mL

## 2012-01-29 MED ORDER — METHOCARBAMOL 100 MG/ML IJ SOLN
500.0000 mg | Freq: Four times a day (QID) | INTRAVENOUS | Status: DC | PRN
  Filled 2012-01-29: qty 5

## 2012-01-29 SURGICAL SUPPLY — 53 items
APL SKNCLS STERI-STRIP NONHPOA (GAUZE/BANDAGES/DRESSINGS) ×1
BENZOIN TINCTURE PRP APPL 2/3 (GAUZE/BANDAGES/DRESSINGS) ×3 IMPLANT
BLADE SURG ROTATE 9660 (MISCELLANEOUS) IMPLANT
BUR ROUND FLUTED 4 SOFT TCH (BURR) IMPLANT
CLOSURE STERI-STRIP 1/4X4 (GAUZE/BANDAGES/DRESSINGS) ×1 IMPLANT
CLOTH BEACON ORANGE TIMEOUT ST (SAFETY) ×2 IMPLANT
COLLAR CERV LO CONTOUR FIRM DE (SOFTGOODS) ×2 IMPLANT
COMPOSITE CERV ALLOGRAFT 6MM (Orthopedic Implant) ×1 IMPLANT
COMPOSITE CERV ALLOGRAFT 7MM (Orthopedic Implant) ×2 IMPLANT
CORDS BIPOLAR (ELECTRODE) ×2 IMPLANT
COVER SURGICAL LIGHT HANDLE (MISCELLANEOUS) ×2 IMPLANT
DRAPE C-ARM 42X72 X-RAY (DRAPES) ×2 IMPLANT
DRAPE MICROSCOPE LEICA (MISCELLANEOUS) ×2 IMPLANT
DRAPE PROXIMA HALF (DRAPES) ×2 IMPLANT
DRILL BIT VUELOCK 12MML (BIT) ×1 IMPLANT
DURAPREP 6ML APPLICATOR 50/CS (WOUND CARE) ×2 IMPLANT
ELECT COATED BLADE 2.86 ST (ELECTRODE) ×2 IMPLANT
ELECT REM PT RETURN 9FT ADLT (ELECTROSURGICAL) ×2
ELECTRODE REM PT RTRN 9FT ADLT (ELECTROSURGICAL) ×1 IMPLANT
EVACUATOR 1/8 PVC DRAIN (DRAIN) ×2 IMPLANT
GAUZE XEROFORM 1X8 LF (GAUZE/BANDAGES/DRESSINGS) ×2 IMPLANT
GLOVE BIOGEL PI IND STRL 7.5 (GLOVE) ×1 IMPLANT
GLOVE BIOGEL PI IND STRL 8 (GLOVE) ×1 IMPLANT
GLOVE BIOGEL PI INDICATOR 7.5 (GLOVE) ×1
GLOVE BIOGEL PI INDICATOR 8 (GLOVE) ×1
GLOVE ECLIPSE 7.0 STRL STRAW (GLOVE) ×2 IMPLANT
GLOVE ORTHO TXT STRL SZ7.5 (GLOVE) ×2 IMPLANT
GOWN PREVENTION PLUS LG XLONG (DISPOSABLE) IMPLANT
GOWN PREVENTION PLUS XLARGE (GOWN DISPOSABLE) ×2 IMPLANT
GOWN STRL NON-REIN LRG LVL3 (GOWN DISPOSABLE) ×4 IMPLANT
HEAD HALTER (SOFTGOODS) ×2 IMPLANT
HEMOSTAT SURGICEL 2X14 (HEMOSTASIS) IMPLANT
KIT BASIN OR (CUSTOM PROCEDURE TRAY) ×2 IMPLANT
KIT ROOM TURNOVER OR (KITS) ×2 IMPLANT
MANIFOLD NEPTUNE II (INSTRUMENTS) ×2 IMPLANT
NEEDLE 25GX 5/8IN NON SAFETY (NEEDLE) ×2 IMPLANT
NS IRRIG 1000ML POUR BTL (IV SOLUTION) ×2 IMPLANT
PACK ORTHO CERVICAL (CUSTOM PROCEDURE TRAY) ×2 IMPLANT
PAD ARMBOARD 7.5X6 YLW CONV (MISCELLANEOUS) ×4 IMPLANT
PATTIES SURGICAL .5 X.5 (GAUZE/BANDAGES/DRESSINGS) IMPLANT
SPONGE GAUZE 4X4 12PLY (GAUZE/BANDAGES/DRESSINGS) ×2 IMPLANT
SPONGE SURGIFOAM ABS GEL 100 (HEMOSTASIS) ×1 IMPLANT
STAPLER VISISTAT 35W (STAPLE) ×2 IMPLANT
STRIP CLOSURE SKIN 1/2X4 (GAUZE/BANDAGES/DRESSINGS) ×2 IMPLANT
SURGIFLO TRUKIT (HEMOSTASIS) IMPLANT
SUT VIC AB 3-0 X1 27 (SUTURE) ×3 IMPLANT
SUT VICRYL 4-0 PS2 18IN ABS (SUTURE) ×3 IMPLANT
SYR 30ML SLIP (SYRINGE) ×2 IMPLANT
SYR BULB 3OZ (MISCELLANEOUS) ×2 IMPLANT
TAPE CLOTH SURG 4X10 WHT LF (GAUZE/BANDAGES/DRESSINGS) ×2 IMPLANT
TOWEL OR 17X24 6PK STRL BLUE (TOWEL DISPOSABLE) ×2 IMPLANT
TOWEL OR 17X26 10 PK STRL BLUE (TOWEL DISPOSABLE) ×2 IMPLANT
WATER STERILE IRR 1000ML POUR (IV SOLUTION) ×2 IMPLANT

## 2012-01-29 NOTE — Brief Op Note (Cosign Needed)
01/29/2012  3:09 PM  PATIENT:  Daisy Edwards  56 y.o. female  PRE-OPERATIVE DIAGNOSIS:  C3-4, C4-5 Spondylosis, Stenosis  POST-OPERATIVE DIAGNOSIS:  C3-4, C4-5 Spondylosis, Stenosis  PROCEDURE:  Procedure(s) (LRB): ANTERIOR CERVICAL DECOMPRESSION/DISCECTOMY FUSION 2 LEVELS (N/A) C3-4 (6mm) and C4-5 (7mm) SURGEON:  Surgeon(s) and Role:    * Eldred Manges, MD - Primary  PHYSICIAN ASSISTANT: Maud Deed PAC  ASSISTANTS: none   ANESTHESIA:   general  EBL:  Total I/O In: 1000 [I.V.:1000] Out: 75 [Blood:75]  BLOOD ADMINISTERED:none  Drains:  hemovac anterior neck   LOCAL MEDICATIONS USED:  NONE  SPECIMEN:  No Specimen  DISPOSITION OF SPECIMEN:  N/A  COUNTS:  YES  TOURNIQUET:  * No tourniquets in log *  DICTATION: .Note written in EPIC  PLAN OF CARE: Admit to inpatient   PATIENT DISPOSITION:  PACU - hemodynamically stable.   Delay start of Pharmacological VTE agent (>24hrs) due to surgical blood loss or risk of bleeding: yes

## 2012-01-29 NOTE — H&P (Signed)
Daisy Edwards is an 56 y.o. female.   Chief Complaint:   problems with persistent pain and more weakness in the right than left arm since original injury on 04/11/2011.     HISTORY OF PRESENT ILLNESS: She was lifting a 30-35 pound roll of foil and had onset of neck, shoulder pain, right-sided neck pain, pain radiating into her arms with numbness and tingling and it has been persistent.  The patient has had a new MRI scan performed on 12/16/2011 which was 1.5 Tesla scanner which shows focal disk protrusion mass effect compression of the cord at the C4-5 level and lesser changes at the C3-4 level with flattening of the ventral thecal sac at 3-4.  There is focal cord edema or myelomalacia at the C4-5 level.  She has continued to work and had deferred any surgical treatment for the last several months, but has had persistent weakness in her arm which has been treated with physical therapy, anti-inflammatories, Medrol Dosepak, activity modification, home traction and her symptoms have persisted.  Plain radiographs demonstrate some retrolisthesis at C4-5 of 2 mm.  Disk space narrowing multiple levels including 3-4, 4-5, 5-6, C6-7 and 7-T1.  She has noticed continued weakness in right arm more than left.  She had some difficulty with stairs, but no problems with ambulation on flat surfaces.    Past Medical History  Diagnosis Date  . Hypertension     stress test- 2008- wnl  . Anxiety     panic attack - yes, in distant past   . Arthritis     cerv. stenosis, spondylosis     Past Surgical History  Procedure Date  . Cholecystectomy     [facility]  . Cardiac catheterization     prior to 2008, not sure where it was done, result- wnl   . Abdominal hysterectomy     Family History  Problem Relation Age of Onset  . Anesthesia problems Neg Hx    Social History:  reports that she has never smoked. She does not have any smokeless tobacco history on file. She reports that she drinks alcohol. She reports that  she does not use illicit drugs.  Allergies: No Known Allergies  Medications Prior to Admission  Medication Dose Route Frequency Provider Last Rate Last Dose  . ceFAZolin (ANCEF) IVPB 2 g/50 mL premix  2 g Intravenous Once Eldred Manges, MD       Medications Prior to Admission  Medication Sig Dispense Refill  . moexipril-hydrochlorothiazide (UNIRETIC) 15-25 MG per tablet Take 1 tablet by mouth every morning.       . Multiple Vitamin (MULITIVITAMIN WITH MINERALS) TABS Take 1 tablet by mouth daily.      . Omega-3 Fatty Acids (FISH OIL TRIPLE STRENGTH PO) Take 1,500 mg by mouth 2 (two) times daily.      . pravastatin (PRAVACHOL) 20 MG tablet Take 20 mg by mouth daily at 2 PM daily at 2 PM.         No results found for this or any previous visit (from the past 48 hour(s)). No results found.  Review of Systems  Constitutional: Negative.   HENT: Positive for neck pain.   Eyes: Negative.   Respiratory: Negative.   Cardiovascular: Negative.   Gastrointestinal: Negative.   Genitourinary: Negative.   Skin: Negative.   Neurological: Positive for tingling and focal weakness.  Endo/Heme/Allergies: Negative.   Psychiatric/Behavioral: Negative.     Blood pressure 135/89, pulse 59, temperature 97.4 F (36.3 C), temperature  source Oral, resp. rate 18, SpO2 95.00%. Physical Exam  Constitutional: She is oriented to person, place, and time. She appears well-developed and well-nourished.  HENT:  Head: Normocephalic and atraumatic.  Eyes: EOM are normal. Pupils are equal, round, and reactive to light.  Cardiovascular: Normal rate and regular rhythm.   Respiratory: Effort normal.  GI: Soft. Bowel sounds are normal.  Musculoskeletal:       PHYSICAL EXAMINATION:  Pain with forward flexion, 3 fingerbreadths chin to chest, pain with compression, some relief with distraction.  Positive Spurling on the right.  No lower extremity clonus. She does have 3+ hyperreflexia bilaterally lower extremities knee  and ankle jerk.  Negative Babinski, 1+ upper extremities, biceps, triceps and brachioradialis.  She has some triceps weakness on the right 1/2 grade.  Biceps weakness on the right 1 grade.  Grip strength is good and symmetrical right and left.  The patient does have some mild right paracentral disk protrusion at C7-T1 on the right.  Neurological: She is alert and oriented to person, place, and time.  Skin: Skin is warm and dry.  Psychiatric: She has a normal mood and affect.     Assessment/Plan  recommendation would be surgical treatment at C4-5 where she has cord edema or myelomalacia and she is having compression of the right side of her cord more than left.  She has some flattening of the ventral thecal sac at C3-4 and I recommend C3-4 and C4-5 2-level cervical diskectomy and fusion.  She would be in a collar for 6 weeks, out of work for a period of 6-7 weeks and then could resume her normal work activity.  We discussed potential for the fusion not to heal which would be a pseudoarthrosis, the possibility of posterior fusion even if this did not occur and our rating for this is listed at roughly 10%.  Some problems with dysphasia, dysphonia postoperatively, use of recliner position postop for a period of a few days to help with the cervical swelling.  Overnight stay in the hospital.  Cervical plate, use of an allograft.  I discussed with her that decompression should take care of the problem with the cord at C4-5 and she should get improvement in her pain and improvement in the weakness that has been present.  She asked about delaying this for several more months.  I discussed with her that with the cord changes, I do not recommend putting this off and then she was at risk for significant increase neurologic compromise if she had a fall or new injury, MVA, etc.  She has done a good job trying to treat this conservatively and avoid surgery and she has had persistent problems since mid 2012 and this has now  been present for at least 8 months.  Procedure discussed.  Indication for surgery reviewed.  A considerable time was spent discussing options, surgical approach, use of the operative microscope, subarticular closure, Steri-Strips, soft cervical collar.  She understands and request that we proceed  Muhammad Vacca M 01/29/2012, 11:36 AM

## 2012-01-29 NOTE — Anesthesia Postprocedure Evaluation (Signed)
  Anesthesia Post-op Note  Patient: Daisy Edwards  Procedure(s) Performed: Procedure(s) (LRB): ANTERIOR CERVICAL DECOMPRESSION/DISCECTOMY FUSION 2 LEVELS (N/A)  Patient Location: PACU  Anesthesia Type: General  Level of Consciousness: awake  Airway and Oxygen Therapy: Patient Spontanous Breathing and Patient connected to nasal cannula oxygen  Post-op Pain: mild  Post-op Assessment: Post-op Vital signs reviewed, Patient's Cardiovascular Status Stable, Respiratory Function Stable, Patent Airway and NAUSEA AND VOMITING PRESENT  Post-op Vital Signs: Reviewed and stable  Complications: No apparent anesthesia complications

## 2012-01-29 NOTE — Transfer of Care (Signed)
Immediate Anesthesia Transfer of Care Note  Patient: Daisy Edwards  Procedure(s) Performed: Procedure(s) (LRB): ANTERIOR CERVICAL DECOMPRESSION/DISCECTOMY FUSION 2 LEVELS (N/A)  Patient Location: PACU  Anesthesia Type: General  Level of Consciousness: awake, alert , oriented and patient cooperative  Airway & Oxygen Therapy: Patient Spontanous Breathing and Patient connected to nasal cannula oxygen  Post-op Assessment: Report given to PACU RN, Post -op Vital signs reviewed and stable and Patient moving all extremities X 4  Post vital signs: Reviewed and stable  Complications: No apparent anesthesia complications

## 2012-01-29 NOTE — Anesthesia Preprocedure Evaluation (Addendum)
Anesthesia Evaluation  Patient identified by MRN, date of birth, ID band Patient awake    Reviewed: Allergy & Precautions, H&P , NPO status , Patient's Chart, lab work & pertinent test results  History of Anesthesia Complications Negative for: history of anesthetic complications  Airway Mallampati: I TM Distance: >3 FB Neck ROM: full    Dental  (+) Teeth Intact and Dental Advisory Given   Pulmonary neg pulmonary ROS,          Cardiovascular Exercise Tolerance: Good hypertension, Pt. on medications Rhythm:regular Rate:Normal     Neuro/Psych PSYCHIATRIC DISORDERS Anxiety  Neuromuscular disease (neck pain; numbness and weakness BUE)    GI/Hepatic negative GI ROS, Neg liver ROS,   Endo/Other  negative endocrine ROS  Renal/GU negative Renal ROS  negative genitourinary   Musculoskeletal  (+) Arthritis -, Osteoarthritis,    Abdominal (+) + obese,   Peds  Hematology negative hematology ROS (+)   Anesthesia Other Findings   Reproductive/Obstetrics negative OB ROS                          Anesthesia Physical Anesthesia Plan  ASA: II  Anesthesia Plan: General ETT   Post-op Pain Management:    Induction: Intravenous  Airway Management Planned: Oral ETT  Additional Equipment:   Intra-op Plan:   Post-operative Plan:   Informed Consent: I have reviewed the patients History and Physical, chart, labs and discussed the procedure including the risks, benefits and alternatives for the proposed anesthesia with the patient or authorized representative who has indicated his/her understanding and acceptance.     Plan Discussed with: CRNA, Surgeon and Anesthesiologist  Anesthesia Plan Comments:         Anesthesia Quick Evaluation

## 2012-01-29 NOTE — Anesthesia Procedure Notes (Signed)
Procedure Name: Intubation Date/Time: 01/29/2012 12:59 PM Performed by: Leona Singleton A Pre-anesthesia Checklist: Patient identified Patient Re-evaluated:Patient Re-evaluated prior to inductionOxygen Delivery Method: Circle system utilized Preoxygenation: Pre-oxygenation with 100% oxygen Intubation Type: IV induction Ventilation: Mask ventilation without difficulty Laryngoscope Size: Miller and 2 Grade View: Grade I Tube type: Oral Tube size: 7.0 mm Number of attempts: 1 Airway Equipment and Method: Stylet and LTA kit utilized Placement Confirmation: ETT inserted through vocal cords under direct vision,  positive ETCO2 and breath sounds checked- equal and bilateral Secured at: 21 cm Tube secured with: Tape Dental Injury: Teeth and Oropharynx as per pre-operative assessment

## 2012-01-30 MED ORDER — METHOCARBAMOL 500 MG PO TABS: 500.0000 mg | ORAL_TABLET | Freq: Four times a day (QID) | ORAL | Status: AC | PRN

## 2012-01-30 MED ORDER — OXYCODONE-ACETAMINOPHEN 5-325 MG PO TABS: 1.0000 | ORAL_TABLET | ORAL | Status: AC | PRN

## 2012-01-30 NOTE — Progress Notes (Signed)
Orthopedic Tech Progress Note Patient Details:  Daisy Edwards 1956-07-17 [id number]  Other Ortho Devices Type of Ortho Device: [place] cervical collar;Other (comment) (soft cervical collar) Ortho Device Location: neck Ortho Device Interventions: Ordered Soft cervical collar times 2  Shahir Karen 01/30/2012, 10:01 AM

## 2012-01-30 NOTE — Op Note (Signed)
Daisy Edwards, Daisy Edwards               ACCOUNT NO.:  [id number]  MEDICAL RECORD NO.:  [id number]  LOCATION:  5041                         FACILITY:  [facility]  PHYSICIAN:  Prentis Langdon C. Ophelia Edwards, M.D.    DATE OF BIRTH:  January 25, 1956  DATE OF PROCEDURE:  01/29/2012 DATE OF DISCHARGE:                              OPERATIVE REPORT   PREOPERATIVE DIAGNOSES: 1. Cervical herniated nucleus pulposus with severe stenosis C4-5. 2. Cervical spondylosis, C3-4.  POSTOPERATIVE DIAGNOSES: 1. Cervical herniated nucleus pulposus with severe stenosis C4-5. 2. Cervical spondylosis, C3-4.  PROCEDURE:  C3-4, C4-5 anterior cervical diskectomy and fusion, allograft and plate.  SURGEON:  Chaynce Schafer C. [name], MD AASSISTANT:  Daisy Deed PA-C medically necessary and present for the entire procedure.  ANESTHESIA:  General.  ESTIMATED BLOOD LOSS:  Minimal.  DRAINS:  1 Hemovac, neck.  BRIEF HISTORY:  This patient had an on-the-job injury with neck injury. MRI scan showing disk protrusion with some pre-existing cervical stenosis congenital with superimposed large disk bulge was causing cord compression with malacia and radiculopathy and some early myelopathy changes.  She has had persistent neck pain, shoulder pain, bilateral hand and arm numbness, and some intermittent leg weakness since the injury.  She had been treated maximal conservative treatment with activity modification, cortisone preparations.  After induction of general anesthesia, preoperative antibiotic prophylaxis, standard prepping and draping, head halter traction, horseshoe head holder.  Arms tucked to the side with pads over the ulnar nerve.  DuraPrep was used.  The area was squared with towels, sterile skin marker, and the planned incision line starting midline concerning the left and Betadine Steri-Drape, sterile Mayo stand at the head, and thyroid sheets and drapes.  Time-out procedure was completed.  Incision was made in the midline extending to the  left.  Platysma was divided in line with the fibers.  Blunt dissection was performed down to the level of longus coli.  Spurs were noted, and C-arm was brought in for localization and 4-5 level was localized.  3-4, 4-5 had disk initially incised with a scalpel to Daisy Edwards the 2 planned levels.  4-5 was done first since this level had significant compression.  Cloward teeth retractors were placed right and left, smooth blades up and down.  The patient had a short neck, chin was somewhat in the way.  Head was rotated to the right.  Diskectomy was performed.  Operative microscope was draped and brought in, and using the bur, Cloward curettes, pituitaries, 1 and 2-mm Kerrisons, disk material was removed progressing down to the posterior longitudinal ligament.  There was significant disk protrusion.  Chunks of disk were removed.  Posterior longitudinal ligament was taken down. Overhanging spurs removed until the dura was completely visualized for 1 gutter to the other.  Spurs had been removed.  The patient with black nerve hook around the edge showed no remaining ligament or disk protrusion.  Sizing showed a 7 mm gave a good tight fit.  Endplates had been prepared mostly by hand.  The bur was used 4 mm, and endplates were rasped with the rasp in preparation to make a nice flat, bed contact. Graft was corticocancellous and was countersunk 2 mm.  Next, subcutaneous  retractors were moved up 1 level.  Identical procedure was repeated.  There was not as much stenosis at this level.  Posterior longitudinal ligament was taken down.  Trial sizers showed a 6 mm gave a good graft fit.  Spurs were removed.  Uncovertebral joints were stripped.  Trial sizers, endplate rasping, and then placement of the graft again with the CRNA pulling on head halter traction, countersinking it 2 mm, good graft fit.  Both grafts were in the midline.  A 30-mm VueLock Biomet EBI plate was selected with 14-mm screws.  Screws were  hand drilled and then inserted.  Area was irrigated.  All 6 screws were filled in prior to placement of the screws.  AP and lateral fluoroscopy was used to confirm appropriate plate, position.  Screws had good tight fit.  Bone was of good quality. After irrigation, operative field was dry.  Hemovac was placed with an in-and-out technique  on the left in line with the skin incision. Platysma closed with 3-0 Vicryl, 4-0 Vicryl subcuticular closure. Tincture of benzoin, Steri-Strips, 4 x 4s, tape, and soft cervical collar.  The patient was transferred to recovery room, and once awake, neurologic exam will be rechecked.  Time-out procedure was completed at the end of the case and instrument count and needle count was correct.     Daisy Edwards, M.D.     MCY/MEDQ  D:  01/29/2012  T:  01/30/2012  Job:  [id number]

## 2012-01-30 NOTE — Discharge Summary (Signed)
Physician Discharge Summary  Patient ID: Daisy Edwards MRN: [id number] DOB/AGE: Jun 17, 1956 56 y.o.  Admit date: 01/29/2012 Discharge date: 01/30/2012  Admission Diagnoses:  Cervical stenosis of spinal canal C3-4, C4-5  Discharge Diagnoses:  Principal Problem:  *Cervical stenosis of spinal canal C3-4, C4-5  Past Medical History  Diagnosis Date  . Hypertension     stress test- 2008- wnl  . Anxiety     panic attack - yes, in distant past   . Arthritis     cerv. stenosis, spondylosis     Surgeries: Procedure(s): ANTERIOR CERVICAL DECOMPRESSION/DISCECTOMY FUSION 2 LEVELS on 01/29/2012   Consultants (if any):  none  Discharged Condition: Improved  Hospital Course: Daisy Edwards is an 56 y.o. female who was admitted 01/29/2012 with a diagnosis of Cervical stenosis of spinal canal and went to the operating room on 01/29/2012 and underwent the above named procedures.    She was given perioperative antibiotics:  Anti-infectives     Start     Dose/Rate Route Frequency Ordered Stop   01/29/12 2100   ceFAZolin (ANCEF) IVPB 1 g/50 mL premix        1 g 100 mL/hr over 30 Minutes Intravenous Every 8 hours 01/29/12 1907 01/30/12 0534   01/29/12 0600   ceFAZolin (ANCEF) IVPB 2 g/50 mL premix        2 g 100 mL/hr over 30 Minutes Intravenous  Once 01/28/12 1043 01/29/12 1255        .  She was given sequential compression devices, early ambulation  She benefited maximally from the hospital stay and there were no complications.    Recent vital signs:  Filed Vitals:   01/29/12 2312  BP: 151/68  Pulse: 67  Temp: 99.2 F (37.3 C)  Resp: 16    Recent laboratory studies:  Lab Results  Component Value Date   HGB 13.6 01/24/2012   Lab Results  Component Value Date   WBC 5.7 01/24/2012   PLT 257 01/24/2012   Lab Results  Component Value Date   INR 1.05 01/24/2012   Lab Results  Component Value Date   NA 140 01/24/2012   K 3.8 01/24/2012   CL 101 01/24/2012   CO2 30 01/24/2012   BUN 12 01/24/2012   CREATININE 0.73 01/24/2012   GLUCOSE 87 01/24/2012    Discharge Medications:   Medication List  As of 01/30/2012  9:07 AM   TAKE these medications         FISH OIL TRIPLE STRENGTH PO   Take 1,500 mg by mouth 2 (two) times daily.      GREEN COFFEE BEAN PO   Take 1 capsule by mouth 2 (two) times daily.      methocarbamol 500 MG tablet   Commonly known as: ROBAXIN   Take 500 mg by mouth 3 (three) times daily as needed.      methocarbamol 500 MG tablet   Commonly known as: ROBAXIN   Take 1 tablet (500 mg total) by mouth every 6 (six) hours as needed.      moexipril-hydrochlorothiazide 15-25 MG per tablet   Commonly known as: UNIRETIC   Take 1 tablet by mouth every morning.      mulitivitamin with minerals Tabs   Take 1 tablet by mouth daily.      oxyCODONE-acetaminophen 5-325 MG per tablet   Commonly known as: PERCOCET   Take 1-2 tablets by mouth every 4 (four) hours as needed.      pravastatin 20 MG tablet  Commonly known as: PRAVACHOL   Take 20 mg by mouth daily at 2 PM daily at 2 PM.            Diagnostic Studies: Dg Chest 2 View  01/24/2012  *RADIOLOGY REPORT*  Clinical Data: Preop respiratory evaluation  CHEST - 2 VIEW  Comparison: 05/15/2009  Findings: Normal heart size.  Normal vascularity.  Lungs are clear without infiltrate or mass.  IMPRESSION: Negative  Original Report Authenticated By: Camelia Phenes, M.D.   Dg Cervical Spine 2-3 Views  01/29/2012  *RADIOLOGY REPORT*  Clinical Data: C3-C5 ACDF  CERVICAL SPINE - 2-3 VIEW  Comparison: MRI 12/13/2011  Findings: Two C-arm images show anterior cervical discectomy and fusion from C3-C5.  Interbody fusion material is well positioned. There is an anterior plate with screw fixation.  IMPRESSION: ACDF C3-C5.  Original Report Authenticated By: Thomasenia Sales, M.D.   Mm Digital Screening  01/13/2012  DG SCREEN MAMMOGRAM BILATERAL Bilateral CC and MLO view(s) were taken.  DIGITAL SCREENING MAMMOGRAM WITH CAD:  There are scattered fibroglandular densities.  No masses or malignant type calcifications are  identified.  Compared with prior studies.  Images were processed with CAD.  IMPRESSION: No specific mammographic evidence of malignancy.  Next screening mammogram is recommended in one  year.  A result letter of this screening mammogram will be mailed directly to the patient.  ASSESSMENT: Negative - BI-RADS 1  Screening mammogram in 1 year. ,    Disposition: Final discharge disposition not confirmed  Discharge Orders    Future Orders Please Complete By Expires   Diet - low sodium heart healthy      Call MD / Call 911      Comments:   If you experience chest pain or shortness of breath, CALL 911 and be transported to the hospital emergency room.  If you develope a fever above 101 F, pus (white drainage) or increased drainage or redness at the wound, or calf pain, call your surgeon's office.   Constipation Prevention      Comments:   Drink plenty of fluids.  Prune juice may be helpful.  You may use a stool softener, such as Colace (over the counter) 100 mg twice a day.  Use MiraLax (over the counter) for constipation as needed.   Increase activity slowly as tolerated      Discharge instructions      Comments:   Wear collar at all times.  Change dressing daily or as needed. Keep wound covered.  No lifting or overhead use of hands.  Walk as tolerated   Driving restrictions      Comments:   No driving   Lifting restrictions      Comments:   No lifting      Follow-up Information    Follow up with YATES,MARK C, MD. Schedule an appointment as soon as possible for a visit in 1 week.   Contact information:   [facility] [address] [place] [address] [phone]           Signed: Wende Neighbors 01/30/2012, 9:07 AM

## 2012-01-30 NOTE — Progress Notes (Signed)
Subjective: Comfortable.  Still some tingling in right arm but less aching.  OOB to bathroom.  Mild sore throat.  Eating well,clears   Objective: Vital signs in last 24 hours: Temp:  [97.4 F (36.3 C)-99.2 F (37.3 C)] 99.2 F (37.3 C) (03/13 2312) Pulse Rate:  [49-90] 67  (03/13 2312) Resp:  [11-22] 16  (03/13 2312) BP: (135-151)/(67-92) 151/68 mmHg (03/13 2312) SpO2:  [95 %-100 %] 99 % (03/13 2312)  Intake/Output from previous day: 03/13 0701 - 03/14 0700 In: 1721 [I.V.:1721] Out: 75 [Blood:75] Intake/Output this shift:    No results found for this basename: HGB:5 in the last 72 hours No results found for this basename: WBC:2,RBC:2,HCT:2,PLT:2 in the last 72 hours No results found for this basename: NA:2,K:2,CL:2,CO2:2,BUN:2,CREATININE:2,GLUCOSE:2,CALCIUM:2 in the last 72 hours No results found for this basename: LABPT:2,INR:2 in the last 72 hours  Neurovascular intact Intact pulses distally Incision: no drainage hemovac out by Dr [name] earlier Assessment/Plan: DC home. Collar at all times OV 2 weeks COD stable   Daisy Edwards 01/30/2012, 9:00 AM

## 2012-02-03 ENCOUNTER — Encounter (HOSPITAL_COMMUNITY): Payer: Self-pay | Admitting: Orthopaedic Surgery

## 2013-02-08 ENCOUNTER — Other Ambulatory Visit: Payer: Self-pay | Admitting: Adult Health

## 2013-03-15 ENCOUNTER — Encounter: Payer: Self-pay | Admitting: Adult Health

## 2013-03-15 ENCOUNTER — Ambulatory Visit (INDEPENDENT_AMBULATORY_CARE_PROVIDER_SITE_OTHER): Payer: Commercial Indemnity | Admitting: Adult Health

## 2013-03-15 ENCOUNTER — Encounter: Payer: Self-pay | Admitting: Family Medicine

## 2013-03-15 VITALS — BP 128/70 | HR 72 | Ht 63.0 in | Wt 182.0 lb

## 2013-03-15 DIAGNOSIS — I10 Essential (primary) hypertension: Secondary | ICD-10-CM

## 2013-03-15 DIAGNOSIS — Z01419 Encounter for gynecological examination (general) (routine) without abnormal findings: Secondary | ICD-10-CM

## 2013-03-15 DIAGNOSIS — N898 Other specified noninflammatory disorders of vagina: Secondary | ICD-10-CM

## 2013-03-15 DIAGNOSIS — Z1212 Encounter for screening for malignant neoplasm of rectum: Secondary | ICD-10-CM

## 2013-03-15 DIAGNOSIS — E7800 Pure hypercholesterolemia, unspecified: Secondary | ICD-10-CM

## 2013-03-15 DIAGNOSIS — E785 Hyperlipidemia, unspecified: Secondary | ICD-10-CM | POA: Insufficient documentation

## 2013-03-15 HISTORY — DX: Pure hypercholesterolemia, unspecified: E78.00

## 2013-03-15 LAB — HEMOCCULT GUIAC POC 1CARD (OFFICE): Fecal Occult Blood, POC: NEGATIVE

## 2013-03-15 NOTE — Patient Instructions (Addendum)
Try luvena and astroglide Mammogram yearly  Colonoscopy in 2018 Sign up for my chart Labs with PCP

## 2013-03-15 NOTE — Progress Notes (Signed)
Patient ID: Daisy Edwards, female   DOB: 12-May-1956, 57 y.o.   MRN: [id number] History of Present Illness: Daisy Edwards is a 57 year old black female, married in for her physical. She is complaining of vaginal dryness and some hot flashes.   Current Medications, Allergies, Past Medical History, Past Surgical History, Family History and Social History were reviewed in [employer] record.     Review of Systems: Patient denies any headaches, blurred vision, shortness of breath, chest pain, abdominal pain, problems with bowel movements, urination, or intercourse. She does have vaginal dryness and some hot flashes.No mood changes, she works 2nd shift 12 hours. We discussed estrogen therapy and SSRIs and she declines at this time   Physical Exam:Blood pressure 128/70, pulse 72, height 5\' 3"  (1.6 m), weight 182 lb (82.555 kg). General:  Well developed, well nourished, no acute distress Skin:  Warm and dry Neck:  Midline trachea, normal thyroid Lungs; Clear to auscultation bilaterally Breast:  No dominant palpable mass, retraction, or nipple discharge Cardiovascular: Regular rate and rhythm Abdomen:  Soft, non tender, no hepatosplenomegaly Pelvic:  External genitalia is normal in appearance.  The vagina is normal in appearance. The cervix and uterus are absent.  No adnexal masses or tenderness noted. Rectal: Good sphincter tone, no polyps, or hemorrhoids felt.  Hemoccult negative.She has a rectocele Extremities:  No swelling or varicosities noted Psych:  No mood changes, alert and cooperative   Impression: Yearly gyn exam Hypertension History of elevated cholesterol Vaginal Dryness  Plan: Try luvena and astroglide Mammogram yearly Colonoscopy in 2018 Labs with PCP Return 1 year for physical

## 2013-03-30 ENCOUNTER — Encounter (INDEPENDENT_AMBULATORY_CARE_PROVIDER_SITE_OTHER): Payer: Managed Care, Other (non HMO)

## 2013-03-30 DIAGNOSIS — R079 Chest pain, unspecified: Secondary | ICD-10-CM

## 2013-04-05 ENCOUNTER — Ambulatory Visit (INDEPENDENT_AMBULATORY_CARE_PROVIDER_SITE_OTHER): Payer: Managed Care, Other (non HMO) | Admitting: Internal Medicine

## 2013-04-05 ENCOUNTER — Encounter: Payer: Self-pay | Admitting: Internal Medicine

## 2013-04-05 VITALS — BP 120/66 | HR 57 | Wt 183.0 lb

## 2013-04-05 DIAGNOSIS — I10 Essential (primary) hypertension: Secondary | ICD-10-CM

## 2013-04-05 DIAGNOSIS — R0789 Other chest pain: Secondary | ICD-10-CM | POA: Insufficient documentation

## 2013-04-05 DIAGNOSIS — Z8249 Family history of ischemic heart disease and other diseases of the circulatory system: Secondary | ICD-10-CM | POA: Insufficient documentation

## 2013-04-05 DIAGNOSIS — E7800 Pure hypercholesterolemia, unspecified: Secondary | ICD-10-CM

## 2013-04-05 NOTE — Patient Instructions (Addendum)
Your physician recommends that you schedule a follow-up appointment as needed  

## 2013-04-05 NOTE — Progress Notes (Signed)
[facility]          OFFICE NOTE   Chief Complaint:  Chest pain, follow-up cardiometabolic testing  Primary Care Physician: Harlow Asa, MD  HPI:  Daisy Edwards is a 57 year old female currently referred to me for evaluation of possible coronary disease. The patient has a history of hypertension, dyslipidemia and cervical spine disease with recent fusion at the C2-C3 levels for neck pain. In January she started having episodes of facial pain which she says have felt like a pain that started in her mid chest and worked up to her jaw and face. She denied any specific toothache. This was not necessarily associated with exertion or relieved by rest. At times, the pain spread across her chest or in the area around the diaphragm and under the left and right breasts with the propensity to go more toward the right side. At times, she also gets an increased heart rate but she feels like this is when she is under stress at work. She works 12-hour shifts 4 to 5 days a week and does some heavy lifting during her job. She also occasionally gets lightheadedness and dizziness. Her cervical fusion surgery was almost a year ago now. She was referred for cardiometabolic testing on 03/30/2013. Her peak VO2 was 109% predicted, which is good, however, her peak heartrate was 91% predicted.  HR and VO2 curves demonstrated a metabolic cost of muscle use as well as a sharp increase in heartrate and flattening of the VO2 curve after anaerobic threshold, indicating an ischemic response. Overall functional capacity was excellent and her test is low risk.  PMHx:  Past Medical History  Diagnosis Date  . Hypertension     stress test- 2008- wnl  . Anxiety     panic attack - yes, in distant past   . Arthritis     cerv. stenosis, spondylosis   . Hyperlipidemia   . Carpal tunnel syndrome on both sides     rt.>lt.  . Elevated cholesterol 03/15/2013  . Cervical vertebral fusion     Past  Surgical History  Procedure Laterality Date  . Cholecystectomy      [facility]  . Cardiac catheterization      prior to 2008, not sure where it was done, result- wnl   . Abdominal hysterectomy    . Anterior cervical decomp/discectomy fusion  01/29/2012    Procedure: ANTERIOR CERVICAL DECOMPRESSION/DISCECTOMY FUSION 2 LEVELS;  Surgeon: Eldred Manges, MD;  Location: [facility] OR;  Service: Orthopedics;  Laterality: N/A;  C3-4, C4-5 Anterior Cervical Discectomy and Fusion, allograft, plate  . Colonoscopy      FAMHx:  Family History  Problem Relation Age of Onset  . Anesthesia problems Neg Hx   . Heart disease Father     MI  . Hypertension Mother   . Aneurysm Mother     brain  . Cancer Maternal Aunt     breast  . Cancer Maternal Grandmother     colon   . Diabetes      SOCHx:   reports that she has never smoked. She has never used smokeless tobacco. She reports that  drinks alcohol. She reports that she does not use illicit drugs.  ALLERGIES:  No Known Allergies  ROS: Pertinent items are noted in HPI.  HOME MEDS: Current Outpatient Prescriptions  Medication Sig Dispense Refill  . aspirin 81 MG tablet Take 81 mg by mouth daily.      Marland Kitchen GREEN COFFEE BEAN  PO Take 1 capsule by mouth 2 (two) times daily.      . moexipril-hydrochlorothiazide (UNIRETIC) 15-25 MG per tablet Take 1 tablet by mouth every morning.       . Multiple Vitamin (MULITIVITAMIN WITH MINERALS) TABS Take 1 tablet by mouth daily.      . Omega-3 Fatty Acids (FISH OIL TRIPLE STRENGTH PO) Take 1,500 mg by mouth daily.       . rosuvastatin (CRESTOR) 10 MG tablet Take 10 mg by mouth daily.      . methocarbamol (ROBAXIN) 500 MG tablet Take 500 mg by mouth 3 (three) times daily as needed.       No current facility-administered medications for this visit.    LABS/IMAGING: No results found for this or any previous visit (from the past 48 hour(s)). No results found.  VITALS: BP 120/66  Pulse 57  Wt 183 lb (83.008 kg)   BMI 32.43 kg/m2  EXAM: deferred today  EKG: Not performed today  ASSESSMENT: 1. Low risk cardiometabolic testing with an ischemic VO2 curve, which may suggest small vessel dysfunction. 2. Marked hypertensive response to exercise, which could indicate small vessel ischemia and diastolic dysfunction with exercise. 3. Hypertension - controlled 4. Obesity 5. Dyslipidemia - on statin  PLAN: 1.   Ms. Ammon has multiple cardiac risk factors and a low risk, but still abnormal cardiometabolic test. Her VO2 is higher, which is prognostically good, however, she has signs of small vessel ischemia and possible exercise-induced diastolic dysfunction with a marked hypertensive response to exercise. I have recommended she start an exercise and weight loss regimen. If she continues to be symptomatic, she may benefit from low-dose b-blocker, however, HR at rest today was in the 50's and bp was normal, so there is little room for additional medications.  As far as small vessel disease is concerned, adding Coenzyme Q10 300 mg /day may be helpful. Statins are also helpful, but she is on one.  I have reassured her about her test results and can see her back as needed.  Chrystie Nose, MD, [facility] Attending Cardiologist [facility]  HILTY,Kenneth C 04/05/2013, 3:41 PM

## 2013-04-10 ENCOUNTER — Encounter: Payer: Self-pay | Admitting: Internal Medicine

## 2013-04-22 ENCOUNTER — Encounter: Payer: Self-pay | Admitting: Internal Medicine

## 2013-04-22 ENCOUNTER — Other Ambulatory Visit: Payer: Self-pay | Admitting: Nurse Practitioner

## 2013-04-22 ENCOUNTER — Other Ambulatory Visit: Payer: Self-pay | Admitting: *Deleted

## 2013-04-22 MED ORDER — LISINOPRIL-HYDROCHLOROTHIAZIDE 20-25 MG PO TABS: 1.0000 | ORAL_TABLET | Freq: Every day | ORAL | Status: DC

## 2013-05-25 ENCOUNTER — Other Ambulatory Visit: Payer: Self-pay | Admitting: Nurse Practitioner

## 2013-06-07 IMAGING — MG MM DIGITAL SCREENING BILAT
4 series · 4 of 4 positions shown · non-contrast
Comparison: none

DG SCREEN MAMMOGRAM BILATERAL
Bilateral CC and MLO view(s) were taken.

DIGITAL SCREENING MAMMOGRAM WITH CAD:
There are scattered fibroglandular densities.  No masses or malignant type calcifications are 
identified.  Compared with prior studies.
Images were processed with CAD.

[L CC]
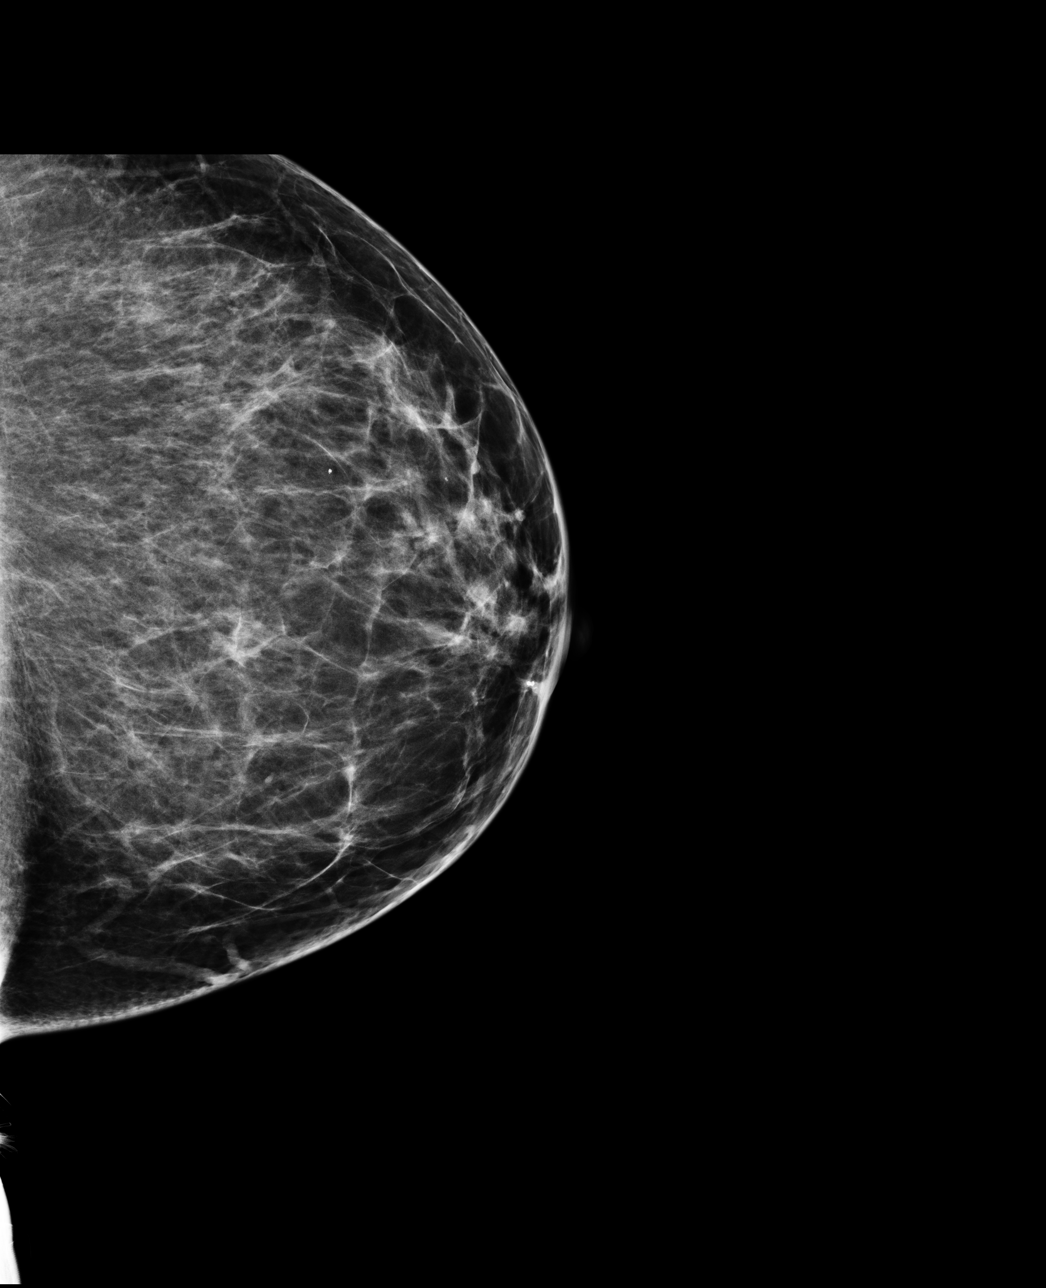

[L MLO]
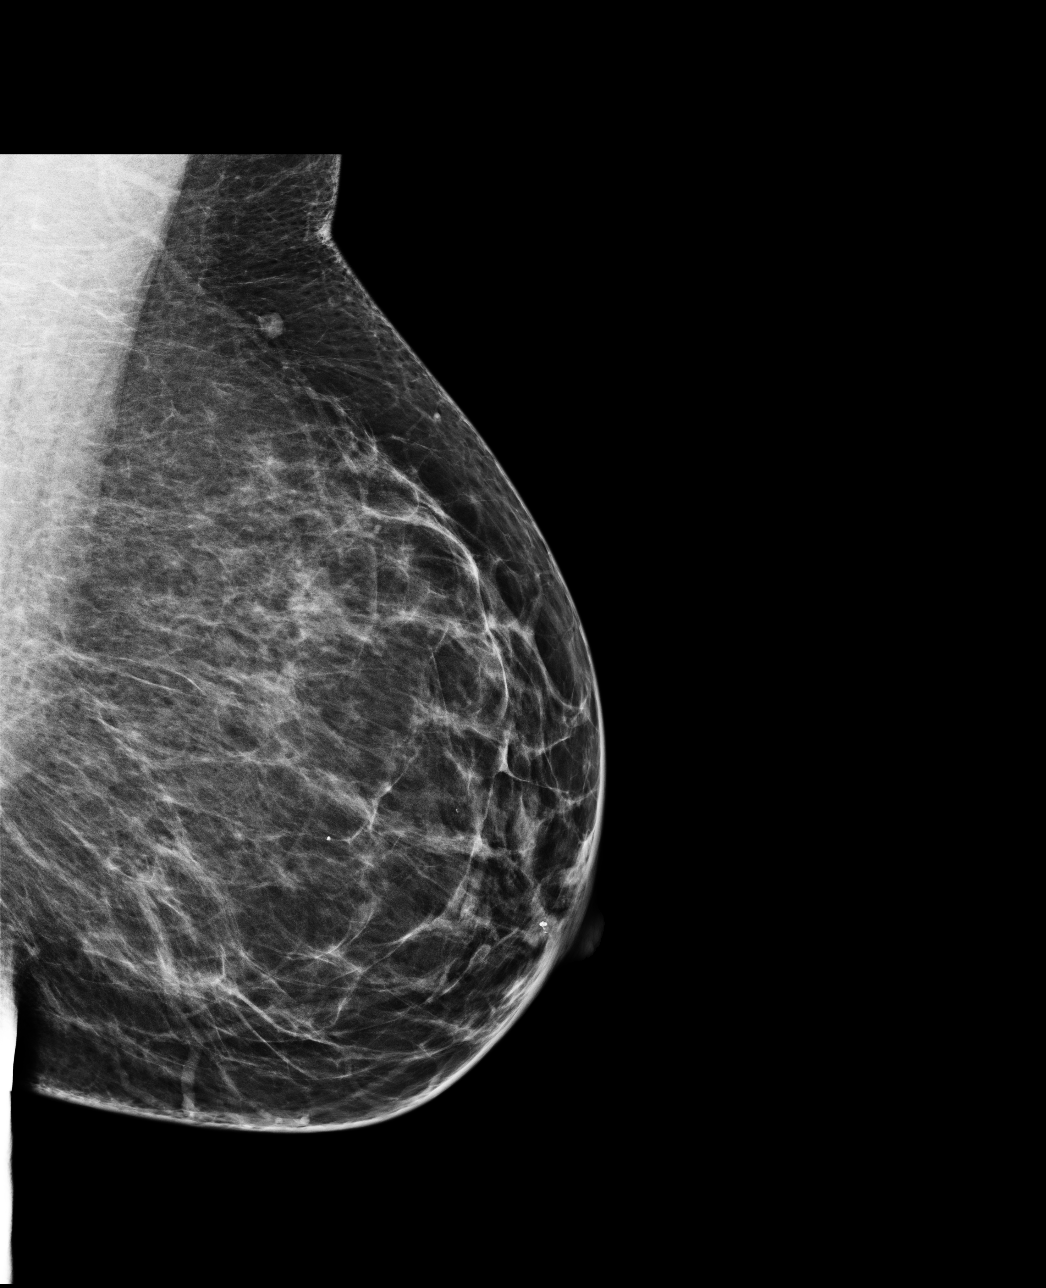

[R CC]
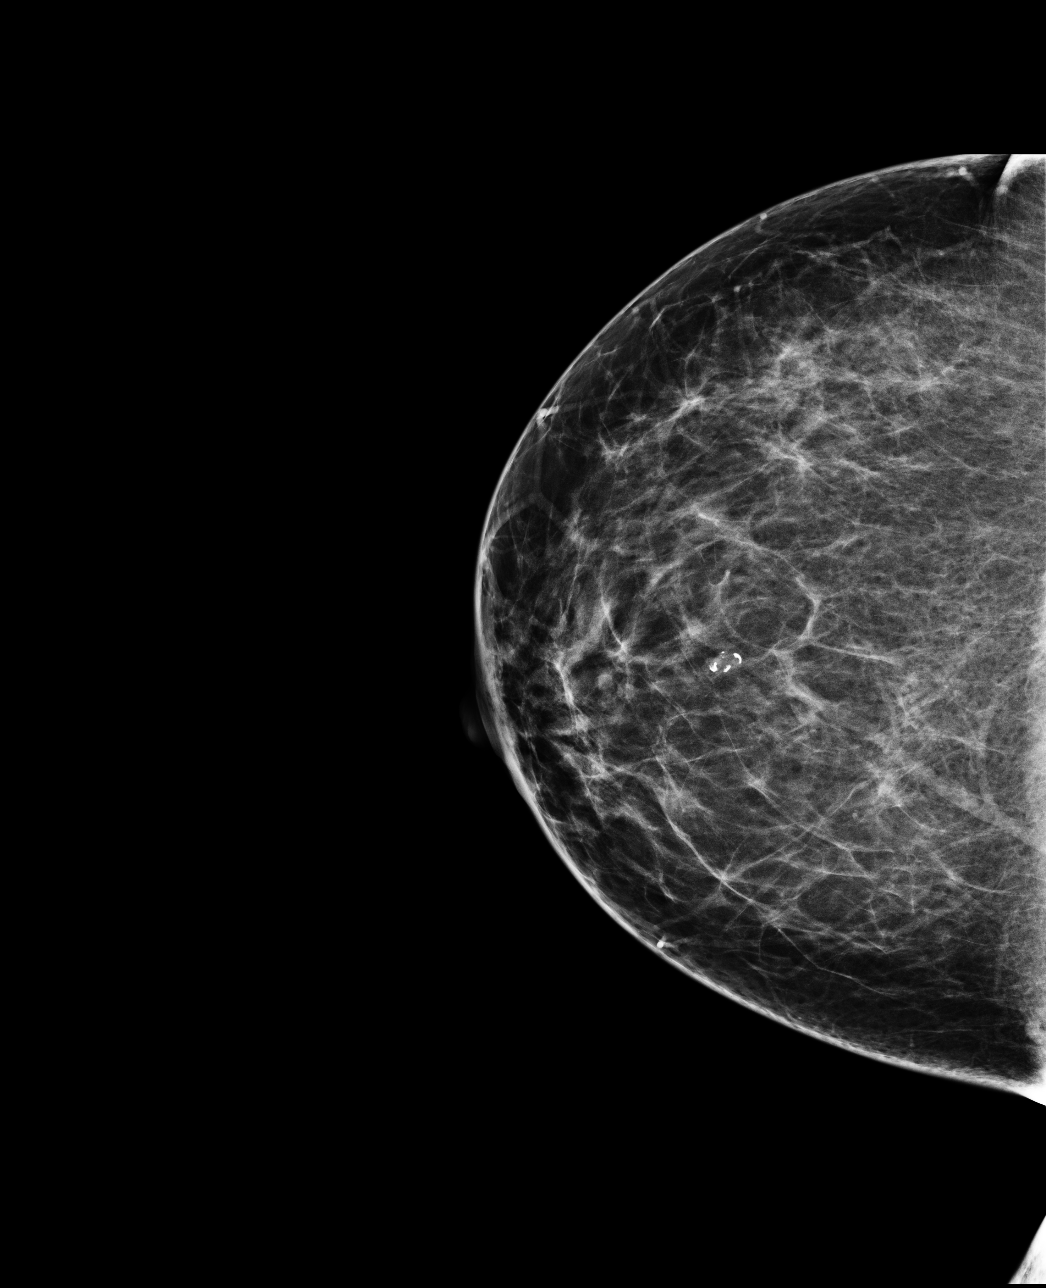

[R MLO]
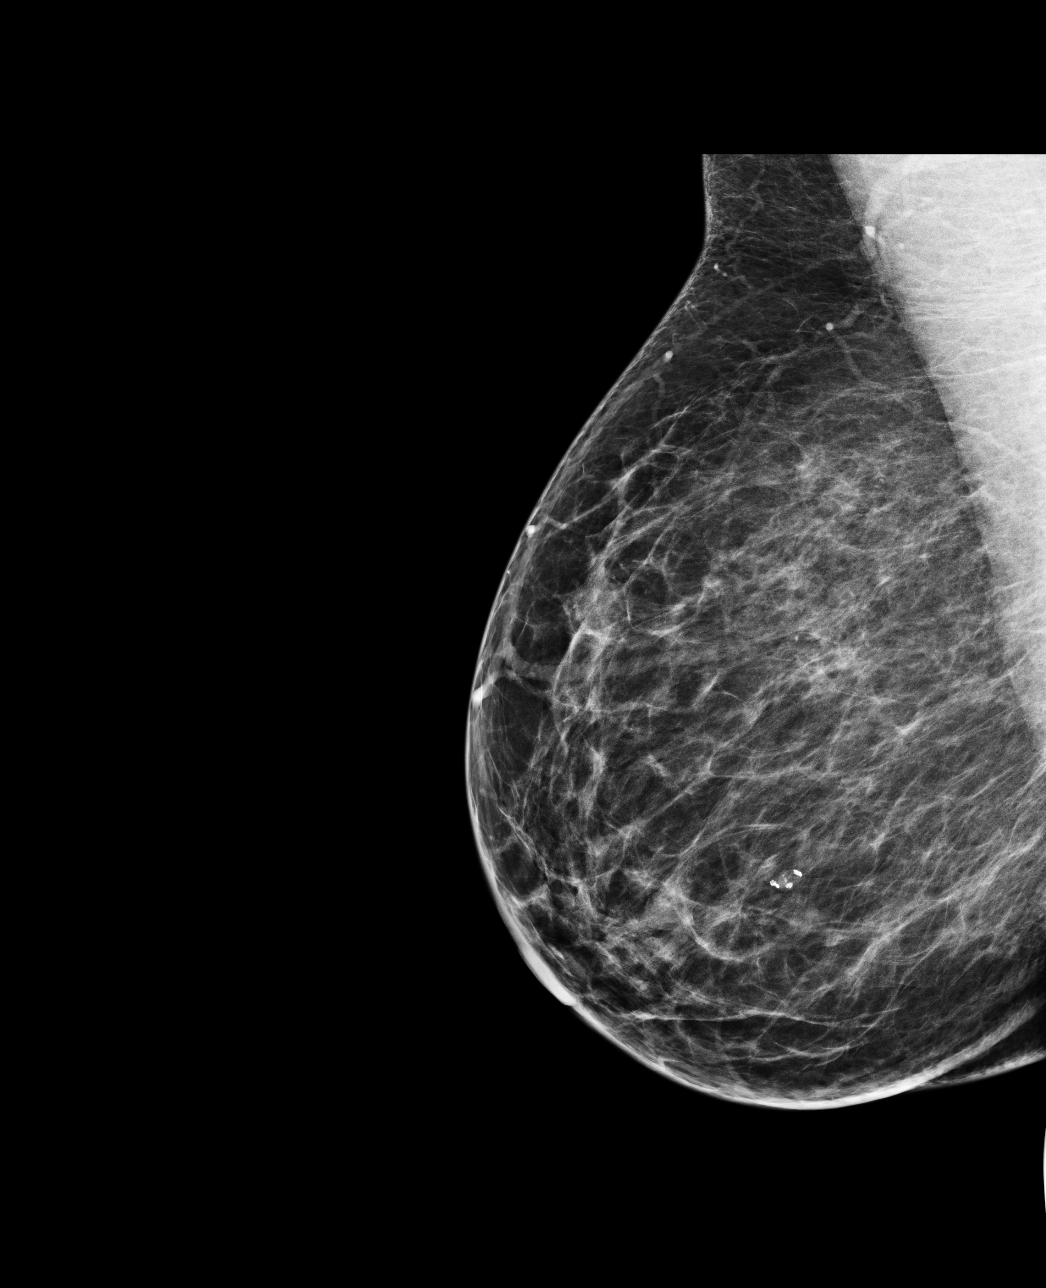

[4 of 4 positions shown; findings below may reference images not displayed]

IMPRESSION: No specific mammographic evidence of malignancy.  Next screening mammogram is recommended in one 
year.

A result letter of this screening mammogram will be mailed directly to the patient.

ASSESSMENT: Negative - BI-RADS 1

Screening mammogram in 1 year.
,

## 2013-06-20 ENCOUNTER — Encounter: Payer: Self-pay | Admitting: *Deleted

## 2013-06-22 ENCOUNTER — Ambulatory Visit (INDEPENDENT_AMBULATORY_CARE_PROVIDER_SITE_OTHER): Payer: Commercial Indemnity | Admitting: Nurse Practitioner

## 2013-06-22 ENCOUNTER — Encounter: Payer: Self-pay | Admitting: Nurse Practitioner

## 2013-06-22 VITALS — BP 118/78 | HR 70 | Ht 63.0 in | Wt 182.0 lb

## 2013-06-22 DIAGNOSIS — J309 Allergic rhinitis, unspecified: Secondary | ICD-10-CM

## 2013-06-22 DIAGNOSIS — I10 Essential (primary) hypertension: Secondary | ICD-10-CM

## 2013-06-22 DIAGNOSIS — F518 Other sleep disorders not due to a substance or known physiological condition: Secondary | ICD-10-CM

## 2013-06-22 DIAGNOSIS — J300 Vasomotor rhinitis: Secondary | ICD-10-CM

## 2013-06-22 DIAGNOSIS — Z72821 Inadequate sleep hygiene: Secondary | ICD-10-CM

## 2013-06-22 DIAGNOSIS — J320 Chronic maxillary sinusitis: Secondary | ICD-10-CM

## 2013-06-22 MED ORDER — AMOXICILLIN-POT CLAVULANATE 875-125 MG PO TABS: 1.0000 | ORAL_TABLET | Freq: Two times a day (BID) | ORAL | Status: DC

## 2013-06-22 MED ORDER — FLUTICASONE PROPIONATE 50 MCG/ACT NA SUSP: 2.0000 | Freq: Every day | NASAL | Status: DC

## 2013-06-22 MED ORDER — LISINOPRIL-HYDROCHLOROTHIAZIDE 20-25 MG PO TABS: 1.0000 | ORAL_TABLET | Freq: Every day | ORAL | Status: DC

## 2013-06-23 ENCOUNTER — Encounter: Payer: Self-pay | Admitting: Nurse Practitioner

## 2013-06-23 NOTE — Progress Notes (Signed)
Subjective:  Presents for recheck on her blood pressure. Has been having some nagging dull headaches. No severe headaches. States they are bearable. Unsure if it could be related to her blood pressure medication. Admits that these have been going on for while. Describes a dull frontal area headache. Runny nose. No cough. No sore throat. Occasional ear pain. No fever. Also headache is worse in the mornings. Better after rest. Averaging 5-6 hours of sleep a night. Has been working 60 hours a week for the past month. States she has not been getting adequate rest.  Objective:   BP 118/78  Pulse 70  Ht 5\' 3"  (1.6 m)  Wt 182 lb (82.555 kg)  BMI 32.25 kg/m2 NAD. Alert, oriented. Fatigued in appearance. TMs mild retracted, no erythema. Distinct tenderness to palpation along the maxillary sinuses. Pharynx mildly injected with cloudy PND noted. Neck supple with mild soft nontender adenopathy. Lungs clear. Heart regular rate rhythm. Lower extremities no edema.  Assessment:Vasomotor rhinitis  Maxillary sinusitis  Essential hypertension, benign  Inadequate sleep hygiene  Hypertension well-controlled  Plan: Explained that headaches can come from her sinus symptoms and lack of sleep. Continue current BP medication for now. Meds ordered this encounter  Medications  . amoxicillin-clavulanate (AUGMENTIN) 875-125 MG per tablet    Sig: Take 1 tablet by mouth 2 (two) times daily.    Dispense:  20 tablet    Refill:  0    Order Specific Question:  Supervising Provider    Answer:  Merlyn Albert [[id number]]  . fluticasone (FLONASE) 50 MCG/ACT nasal spray    Sig: Place 2 sprays into the nose daily.    Dispense:  16 g    Refill:  5    Order Specific Question:  Supervising Provider    Answer:  Merlyn Albert [[id number]]  . lisinopril-hydrochlorothiazide (PRINZIDE,ZESTORETIC) 20-25 MG per tablet    Sig: Take 1 tablet by mouth daily.    Dispense:  90 tablet    Refill:  1    Order Specific Question:   Supervising Provider    Answer:  Merlyn Albert [[id number]]   OTC antihistamines as directed. Recheck in 3-4 months, call back sooner if headaches persist. Strongly recommend stress reduction and adequate rest.

## 2013-06-23 NOTE — Assessment & Plan Note (Signed)
Stable/well-controlled. Continue current regimen.

## 2013-07-23 ENCOUNTER — Other Ambulatory Visit: Payer: Self-pay | Admitting: Nurse Practitioner

## 2013-12-18 ENCOUNTER — Other Ambulatory Visit: Payer: Self-pay | Admitting: Nurse Practitioner

## 2014-01-12 ENCOUNTER — Other Ambulatory Visit: Payer: Self-pay | Admitting: Family Medicine

## 2014-01-12 DIAGNOSIS — Z139 Encounter for screening, unspecified: Secondary | ICD-10-CM

## 2014-01-17 ENCOUNTER — Other Ambulatory Visit: Payer: Self-pay | Admitting: Family Medicine

## 2014-01-17 ENCOUNTER — Ambulatory Visit (HOSPITAL_COMMUNITY)
Admission: RE | Admit: 2014-01-17 | Discharge: 2014-01-17 | Disposition: A | Discharge status: Home / Self Care | Payer: Managed Care, Other (non HMO) | Source: Ambulatory Visit | Attending: Family Medicine | Admitting: Family Medicine

## 2014-01-17 DIAGNOSIS — Z1231 Encounter for screening mammogram for malignant neoplasm of breast: Secondary | ICD-10-CM

## 2014-01-18 ENCOUNTER — Other Ambulatory Visit: Payer: Self-pay | Admitting: Nurse Practitioner

## 2014-02-18 ENCOUNTER — Other Ambulatory Visit: Payer: Self-pay | Admitting: Family Medicine

## 2014-02-28 ENCOUNTER — Ambulatory Visit (INDEPENDENT_AMBULATORY_CARE_PROVIDER_SITE_OTHER): Payer: Commercial Indemnity | Admitting: Family Medicine

## 2014-02-28 ENCOUNTER — Encounter: Payer: Self-pay | Admitting: Family Medicine

## 2014-02-28 VITALS — BP 132/78 | Ht 62.0 in | Wt 189.0 lb

## 2014-02-28 DIAGNOSIS — I10 Essential (primary) hypertension: Secondary | ICD-10-CM

## 2014-02-28 DIAGNOSIS — K3189 Other diseases of stomach and duodenum: Secondary | ICD-10-CM

## 2014-02-28 DIAGNOSIS — R1013 Epigastric pain: Secondary | ICD-10-CM

## 2014-02-28 DIAGNOSIS — Z79899 Other long term (current) drug therapy: Secondary | ICD-10-CM

## 2014-02-28 DIAGNOSIS — E785 Hyperlipidemia, unspecified: Secondary | ICD-10-CM

## 2014-02-28 DIAGNOSIS — E7800 Pure hypercholesterolemia, unspecified: Secondary | ICD-10-CM

## 2014-02-28 MED ORDER — OMEPRAZOLE 20 MG PO CPDR: 20.0000 mg | DELAYED_RELEASE_CAPSULE | Freq: Every day | ORAL | Status: DC

## 2014-02-28 MED ORDER — LISINOPRIL-HYDROCHLOROTHIAZIDE 20-25 MG PO TABS: ORAL_TABLET | ORAL | Status: DC

## 2014-02-28 MED ORDER — ROSUVASTATIN CALCIUM 10 MG PO TABS: ORAL_TABLET | ORAL | Status: DC

## 2014-02-28 NOTE — Progress Notes (Signed)
   Subjective:    Patient ID: Daisy Edwards, female    DOB: [birth year], 58 y.o.   MRN: [id number]  HPIHypertension. Sticking with blood pressure medicine. Generally does not check blood pressure. No obvious side effects. Watching salt in diet.  Tenderness in the upper part of stomach. Comes and goes. Started a few months ago.   Notes occas discomfort  No sig hx of heartburn and reflux  Watches diet mostly  Sticking with crestor. Trying to watch diet. No obvious side effects with medicine.  Exercising regularly, likes     Review of Systems No chest pain no back pain no headache status post cholecystectomy    Objective:   Physical Exam Alert no apparent distress. HEENT normal. Blood pressure good on repeat. Lungs clear. Heart regular in rhythm. Abdomen minimal epigastric tenderness no rebound no guarding no masses       Assessment & Plan:  Will impression 1 reflux with possible element of gastritis #2 hypertension good control. #3 hyperlipidemia status uncertain. Plan appropriate blood work. Diet exercise discussed. Omeprazole when necessary for reflux. Exercise encourage. Check every 6 months. WSL

## 2014-03-05 LAB — HEPATIC FUNCTION PANEL
ALT: 19 U/L (ref 0–35)
AST: 19 U/L (ref 0–37)
Albumin: 4.5 g/dL (ref 3.5–5.2)
Alkaline Phosphatase: 55 U/L (ref 39–117)
Bilirubin, Direct: 0.1 mg/dL (ref 0.0–0.3)
Indirect Bilirubin: 0.5 mg/dL (ref 0.2–1.2)
Total Bilirubin: 0.6 mg/dL (ref 0.2–1.2)
Total Protein: 7 g/dL (ref 6.0–8.3)

## 2014-03-05 LAB — BASIC METABOLIC PANEL
BUN: 10 mg/dL (ref 6–23)
CO2: 28 mEq/L (ref 19–32)
Calcium: 9.5 mg/dL (ref 8.4–10.5)
Chloride: 99 mEq/L (ref 96–112)
Creat: 0.86 mg/dL (ref 0.50–1.10)
Glucose, Bld: 89 mg/dL (ref 70–99)
Potassium: 4 mEq/L (ref 3.5–5.3)
Sodium: 137 mEq/L (ref 135–145)

## 2014-03-05 LAB — LIPID PANEL
Cholesterol: 168 mg/dL (ref 0–200)
HDL: 48 mg/dL (ref >39)
LDL Cholesterol: 101 mg/dL — ABNORMAL HIGH (ref 0–99)
Total CHOL/HDL Ratio: 3.5 Ratio
Triglycerides: 96 mg/dL (ref <150)
VLDL: 19 mg/dL (ref 0–40)

## 2014-03-08 ENCOUNTER — Encounter: Payer: Self-pay | Admitting: Family Medicine

## 2014-03-21 ENCOUNTER — Encounter: Payer: Self-pay | Admitting: Adult Health

## 2014-03-21 ENCOUNTER — Ambulatory Visit (INDEPENDENT_AMBULATORY_CARE_PROVIDER_SITE_OTHER): Payer: Commercial Indemnity | Admitting: Adult Health

## 2014-03-21 VITALS — BP 124/76 | HR 74 | Ht 64.0 in | Wt 186.0 lb

## 2014-03-21 DIAGNOSIS — Z01419 Encounter for gynecological examination (general) (routine) without abnormal findings: Secondary | ICD-10-CM

## 2014-03-21 DIAGNOSIS — Z1212 Encounter for screening for malignant neoplasm of rectum: Secondary | ICD-10-CM

## 2014-03-21 LAB — HEMOCCULT GUIAC POC 1CARD (OFFICE): Fecal Occult Blood, POC: NEGATIVE

## 2014-03-21 NOTE — Progress Notes (Signed)
Patient ID: MALENE BLAYDES, female   DOB: 03/16/1956, 58 y.o.   MRN: [id number] History of Present Illness: Venora is a 58 year old black female,married in for a physical.Has hot flashes still and will have an occasional twinge right side.   Current Medications, Allergies, Past Medical History, Past Surgical History, Family History and Social History were reviewed in [employer] record.     Review of Systems: Patient denies any headaches, blurred vision, shortness of breath, chest pain, abdominal pain, problems with bowel movements, urination, or intercourse.  No joint pain or mood swings, see HPI for positives.   Physical Exam:BP 124/76  Pulse 74  Ht 5\' 4"  (1.626 m)  Wt 186 lb (84.369 kg)  BMI 31.91 kg/m2 General:  Well developed, well nourished, no acute distress Skin:  Warm and dry Neck:  Midline trachea, normal thyroid Lungs; Clear to auscultation bilaterally Breast:  No dominant palpable mass, retraction, or nipple discharge Cardiovascular: Regular rate and rhythm Abdomen:  Soft, non tender, no hepatosplenomegaly Pelvic:  External genitalia is normal in appearance.  The vagina is normal in appearance. The cervix and uterus are absent. No  adnexal masses or tenderness noted. Rectal: Good sphincter tone, no polyps, mild hemorrhoids felt.  Hemoccult negative.+rectocele felt. Extremities:  No swelling or varicosities noted Psych:  No mood changes,alert and cooperative,seems happy Discussed if twinges persist will get [place] and could try non hormonal meds for hot flashes.  Impression: Yearly gyn exam no pap    Plan: Physical in 1 year Mammogram yearly  Labs per PCP Colonoscopy per GI Review handout on [facility]

## 2014-03-21 NOTE — Patient Instructions (Signed)
Physical in 1 year Mammogram yearly  

## 2014-05-09 ENCOUNTER — Telehealth: Payer: Self-pay | Admitting: Family Medicine

## 2014-05-09 ENCOUNTER — Other Ambulatory Visit: Payer: Self-pay | Admitting: *Deleted

## 2014-05-09 DIAGNOSIS — M25569 Pain in unspecified knee: Secondary | ICD-10-CM

## 2014-05-09 NOTE — Telephone Encounter (Signed)
Patient would like a referral to [facility] because her insurance is no longer covering [place].

## 2014-05-09 NOTE — Telephone Encounter (Signed)
Ref in and pt notified

## 2014-05-09 NOTE — Telephone Encounter (Signed)
Ok lets do 

## 2014-06-10 ENCOUNTER — Ambulatory Visit (INDEPENDENT_AMBULATORY_CARE_PROVIDER_SITE_OTHER): Payer: Commercial Indemnity | Admitting: Family Medicine

## 2014-06-10 ENCOUNTER — Encounter: Payer: Self-pay | Admitting: Family Medicine

## 2014-06-10 VITALS — BP 130/80 | Temp 98.2°F | Ht 62.0 in | Wt 180.5 lb

## 2014-06-10 DIAGNOSIS — H9209 Otalgia, unspecified ear: Secondary | ICD-10-CM

## 2014-06-10 DIAGNOSIS — H9202 Otalgia, left ear: Secondary | ICD-10-CM

## 2014-06-10 NOTE — Progress Notes (Signed)
   Subjective:    Patient ID: Daisy Edwards, female    DOB: December 14, 1955, 58 y.o.   MRN: [id number]  Otalgia  There is pain in the left ear. This is a new problem. The current episode started yesterday. The problem occurs constantly. The problem has been unchanged. There has been no fever. The pain is at a severity of 0/10. The patient is experiencing no pain. Treatments tried: perioxide. The treatment provided no relief.   somehting flew in the yr  Put peroxide in and washed outfelt wings "flapping in ear" not painful but annoying  No other concerns at this time.   Concerned she may have harmed herself with the peroxide.  Review of Systems  HENT: Positive for ear pain.    No headache no sore throat no abdominal pain no chest pain ROS otherwise negative    Objective:   Physical Exam  Alert no apparent distress. HEENT trace normal neck supple no lymphadenopathy. External canals completely normal eardrums normal no foreign body in left external canal.      Assessment & Plan:  Impression persistent otalgia of falling foreign body within the sac discussed plan white vinegar rubbing alcohol combination twice a day. Symptomatic care discussed. 15 minutes spent most in discussion. WSL

## 2014-06-10 NOTE — Patient Instructions (Signed)
In future, water in the ears, or low gr irritaion aor inflammation  White vinegar and rubbing alcohol 50 50 comb put four drops in both ears twice per day

## 2014-06-14 ENCOUNTER — Other Ambulatory Visit: Payer: Self-pay | Admitting: *Deleted

## 2014-06-14 DIAGNOSIS — I83893 Varicose veins of bilateral lower extremities with other complications: Secondary | ICD-10-CM

## 2014-06-21 DIAGNOSIS — Z029 Encounter for administrative examinations, unspecified: Secondary | ICD-10-CM

## 2014-07-18 ENCOUNTER — Encounter: Payer: Self-pay | Admitting: Vascular Surgery

## 2014-07-19 ENCOUNTER — Ambulatory Visit (HOSPITAL_COMMUNITY)
Admission: RE | Admit: 2014-07-19 | Discharge: 2014-07-19 | Disposition: A | Discharge status: Home / Self Care | Payer: Managed Care, Other (non HMO) | Source: Ambulatory Visit | Attending: Vascular Surgery | Admitting: Vascular Surgery

## 2014-07-19 ENCOUNTER — Encounter: Payer: Self-pay | Admitting: Vascular Surgery

## 2014-07-19 ENCOUNTER — Ambulatory Visit (INDEPENDENT_AMBULATORY_CARE_PROVIDER_SITE_OTHER): Payer: Managed Care, Other (non HMO) | Admitting: Vascular Surgery

## 2014-07-19 VITALS — BP 106/64 | HR 76 | Ht 62.0 in | Wt 182.0 lb

## 2014-07-19 DIAGNOSIS — R0601 Orthopnea: Secondary | ICD-10-CM | POA: Diagnosis not present

## 2014-07-19 DIAGNOSIS — Z7982 Long term (current) use of aspirin: Secondary | ICD-10-CM | POA: Diagnosis not present

## 2014-07-19 DIAGNOSIS — R042 Hemoptysis: Secondary | ICD-10-CM | POA: Insufficient documentation

## 2014-07-19 DIAGNOSIS — R079 Chest pain, unspecified: Secondary | ICD-10-CM | POA: Diagnosis not present

## 2014-07-19 DIAGNOSIS — R42 Dizziness and giddiness: Secondary | ICD-10-CM | POA: Insufficient documentation

## 2014-07-19 DIAGNOSIS — I83893 Varicose veins of bilateral lower extremities with other complications: Secondary | ICD-10-CM | POA: Diagnosis present

## 2014-07-19 DIAGNOSIS — I8390 Asymptomatic varicose veins of unspecified lower extremity: Secondary | ICD-10-CM

## 2014-07-19 DIAGNOSIS — R0609 Other forms of dyspnea: Secondary | ICD-10-CM | POA: Insufficient documentation

## 2014-07-19 DIAGNOSIS — E785 Hyperlipidemia, unspecified: Secondary | ICD-10-CM | POA: Insufficient documentation

## 2014-07-19 DIAGNOSIS — I10 Essential (primary) hypertension: Secondary | ICD-10-CM | POA: Insufficient documentation

## 2014-07-19 DIAGNOSIS — R0989 Other specified symptoms and signs involving the circulatory and respiratory systems: Secondary | ICD-10-CM | POA: Insufficient documentation

## 2014-07-19 DIAGNOSIS — I8393 Asymptomatic varicose veins of bilateral lower extremities: Secondary | ICD-10-CM

## 2014-07-19 NOTE — Progress Notes (Signed)
Subjective:     Patient ID: Daisy Edwards, female   DOB: 31-Oct-1956, 58 y.o.   MRN: [id number]  HPI this 58 year old female is evaluated for pain in her right lower extremity felt to be venous in origin. Patient is status post laser ablation of both great saphenous veins right and left performed at [facility] in 2014. This was done by Dr. Linus Mako. Patient states she has continued to have some aching discomfort in the anterior aspect of the right leg. She was told to come here for insurance reasons. He has no history of DVT or thrombophlebitis or stasis ulcers or bleeding. She does not relax to compression stockings.  Past Medical History  Diagnosis Date  . Hypertension     stress test- 2008- wnl  . Anxiety     panic attack - yes, in distant past   . Arthritis     cerv. stenosis, spondylosis   . Hyperlipidemia   . Carpal tunnel syndrome on both sides     rt.>lt.  . Elevated cholesterol 03/15/2013  . Cervical vertebral fusion   . CTS (carpal tunnel syndrome)   . Impaired fasting glucose   . Back pain, chronic   . Chronic leg pain   . Anemia     History  Substance Use Topics  . Smoking status: Never Smoker   . Smokeless tobacco: Never Used     Comment: quit 30 years ago   . Alcohol Use: 2.4 oz/week    4 Glasses of wine per week     Comment: wine, socially     Family History  Problem Relation Age of Onset  . Anesthesia problems Neg Hx   . Heart disease Father     MI  . Heart attack Father   . Hypertension Mother   . Aneurysm Mother     brain  . Cancer Maternal Aunt     breast  . Cancer Maternal Grandmother     colon   . Diabetes      No Known Allergies  Current outpatient prescriptions:aspirin 81 MG tablet, Take 81 mg by mouth daily., Disp: , Rfl: ;  lisinopril-hydrochlorothiazide (PRINZIDE,ZESTORETIC) 20-25 MG per tablet, TAKE ONE TABLET BY MOUTH ONCE DAILY., Disp: 30 tablet, Rfl: 5;  Multiple Vitamin (MULITIVITAMIN WITH MINERALS) TABS, Take 1  tablet by mouth daily., Disp: , Rfl: ;  Omega-3 Fatty Acids (FISH OIL TRIPLE STRENGTH PO), Take 1,500 mg by mouth daily. , Disp: , Rfl:  omeprazole (PRILOSEC) 20 MG capsule, Take 1 capsule (20 mg total) by mouth daily., Disp: 30 capsule, Rfl: 11;  rosuvastatin (CRESTOR) 10 MG tablet, TAKE 1 TABLET DAILY FOR CHOLESTEROL., Disp: 30 tablet, Rfl: 5  BP 106/64  Pulse 76  Ht 5\' 2"  (1.575 m)  Wt 182 lb (82.555 kg)  BMI 33.28 kg/m2  SpO2 98%  Body mass index is 33.28 kg/(m^2).          Review of Systems chest pain, dyspnea on exertion, PND, orthopnea, hemoptysis, has occasional dizziness. All other systems negative the complete review of systems    Objective:   Physical Exam BP 106/64  Pulse 76  Ht 5\' 2"  (1.575 m)  Wt 182 lb (82.555 kg)  BMI 33.28 kg/m2  SpO2 98%  Gen.-alert and oriented x3 in no apparent distress HEENT normal for age Lungs no rhonchi or wheezing Cardiovascular regular rhythm no murmurs carotid pulses 3+ palpable no bruits audible Abdomen soft nontender no palpable masses Musculoskeletal free of  major deformities  Skin clear -no rashes Neurologic normal Lower extremities 3+ femoral and dorsalis pedis pulses palpable bilaterally with no edema Right leg has a few small spider veins in the distal anterior aspect of the pretibial region with no hyperpigmentation, ulceration, bulging varicosities, or edema. Left leg has minimal spider veins laterally in the thigh.  Today I were to venous duplex exam the right leg which are reviewed and interpreted. The right great saphenous vein is absent having been ablated there is no DVT or deep venous reflux. The right small saphenous vein is free of reflux.       Assessment:     No venous pathology identified-small spider veins right lower leg-do not require treatment    Plan:     Return to see [place] on when necessary basis

## 2014-08-31 ENCOUNTER — Other Ambulatory Visit: Payer: Self-pay | Admitting: Family Medicine

## 2014-09-08 ENCOUNTER — Ambulatory Visit: Payer: Commercial Indemnity | Admitting: Family Medicine

## 2014-09-09 ENCOUNTER — Encounter: Payer: Self-pay | Admitting: Nurse Practitioner

## 2014-09-09 ENCOUNTER — Ambulatory Visit (INDEPENDENT_AMBULATORY_CARE_PROVIDER_SITE_OTHER): Payer: Commercial Indemnity | Admitting: Nurse Practitioner

## 2014-09-09 VITALS — BP 138/98 | Ht 62.0 in | Wt 183.0 lb

## 2014-09-09 DIAGNOSIS — E785 Hyperlipidemia, unspecified: Secondary | ICD-10-CM

## 2014-09-09 DIAGNOSIS — I10 Essential (primary) hypertension: Secondary | ICD-10-CM

## 2014-09-09 DIAGNOSIS — Z23 Encounter for immunization: Secondary | ICD-10-CM

## 2014-09-09 DIAGNOSIS — Z79899 Other long term (current) drug therapy: Secondary | ICD-10-CM

## 2014-09-09 DIAGNOSIS — J300 Vasomotor rhinitis: Secondary | ICD-10-CM

## 2014-09-09 DIAGNOSIS — K2970 Gastritis, unspecified, without bleeding: Secondary | ICD-10-CM

## 2014-09-09 MED ORDER — ROSUVASTATIN CALCIUM 10 MG PO TABS: ORAL_TABLET | ORAL | Status: DC

## 2014-09-09 MED ORDER — LISINOPRIL-HYDROCHLOROTHIAZIDE 20-25 MG PO TABS: ORAL_TABLET | ORAL | Status: DC

## 2014-09-09 NOTE — Patient Instructions (Signed)
OTC antihistamine Nasacort AQ as directed Zaditor eye drops   Gastroesophageal Reflux Disease, Adult Gastroesophageal reflux disease (GERD) happens when acid from your stomach flows up into the esophagus. When acid comes in contact with the esophagus, the acid causes soreness (inflammation) in the esophagus. Over time, GERD may create small holes (ulcers) in the lining of the esophagus. CAUSES   Increased body weight. This puts pressure on the stomach, making acid rise from the stomach into the esophagus.  Smoking. This increases acid production in the stomach.  Drinking alcohol. This causes decreased pressure in the lower esophageal sphincter (valve or ring of muscle between the esophagus and stomach), allowing acid from the stomach into the esophagus.  Late evening meals and a full stomach. This increases pressure and acid production in the stomach.  A malformed lower esophageal sphincter. Sometimes, no cause is found. SYMPTOMS   Burning pain in the lower part of the mid-chest behind the breastbone and in the mid-stomach area. This may occur twice a week or more often.  Trouble swallowing.  Sore throat.  Dry cough.  Asthma-like symptoms including chest tightness, shortness of breath, or wheezing. DIAGNOSIS  Your caregiver may be able to diagnose GERD based on your symptoms. In some cases, X-rays and other tests may be done to check for complications or to check the condition of your stomach and esophagus. TREATMENT  Your caregiver may recommend over-the-counter or prescription medicines to help decrease acid production. Ask your caregiver before starting or adding any new medicines.  HOME CARE INSTRUCTIONS   Change the factors that you can control. Ask your caregiver for guidance concerning weight loss, quitting smoking, and alcohol consumption.  Avoid foods and drinks that make your symptoms worse, such as:  Caffeine or alcoholic drinks.  Chocolate.  Peppermint or mint  flavorings.  Garlic and onions.  Spicy foods.  Citrus fruits, such as oranges, lemons, or limes.  Tomato-based foods such as sauce, chili, salsa, and pizza.  Fried and fatty foods.  Avoid lying down for the 3 hours prior to your bedtime or prior to taking a nap.  Eat small, frequent meals instead of large meals.  Wear loose-fitting clothing. Do not wear anything tight around your waist that causes pressure on your stomach.  Raise the head of your bed 6 to 8 inches with wood blocks to help you sleep. Extra pillows will not help.  Only take over-the-counter or prescription medicines for pain, discomfort, or fever as directed by your caregiver.  Do not take aspirin, ibuprofen, or other nonsteroidal anti-inflammatory drugs (NSAIDs). SEEK IMMEDIATE MEDICAL CARE IF:   You have pain in your arms, neck, jaw, teeth, or back.  Your pain increases or changes in intensity or duration.  You develop nausea, vomiting, or sweating (diaphoresis).  You develop shortness of breath, or you faint.  Your vomit is green, yellow, black, or looks like coffee grounds or blood.  Your stool is red, bloody, or black. These symptoms could be signs of other problems, such as heart disease, gastric bleeding, or esophageal bleeding. MAKE SURE YOU:   Understand these instructions.  Will watch your condition.  Will get help right away if you are not doing well or get worse. Document Released: 08/14/2005 Document Revised: 01/27/2012 Document Reviewed: 05/24/2011 [facility] Patient Information 2015 [place], [place]. This information is not intended to replace advice given to you by your health care provider. Make sure you discuss any questions you have with your health care provider.

## 2014-09-13 ENCOUNTER — Encounter: Payer: Self-pay | Admitting: Nurse Practitioner

## 2014-09-13 NOTE — Progress Notes (Signed)
Subjective:  Presents for routine follow up. Active job; walks at work about 12 hours. Healthy diet. Slight acid reflux. Tightness in epigastric area. No pain. Usually 2 hours after eating. Caffeine intake. Nonsmoker. Occasional glass of wine. Diet includes tomatoes. Has been off Omeprazole. Bowels nl. No CP/ischemic type pain or SOB. Gets regular preventive health exams with GYN. Sniffles and light headedness early this am which has resolved. No fever or cough.   Objective:   BP 138/98  Ht 5\' 2"  (1.575 m)  Wt 183 lb (83.008 kg)  BMI 33.46 kg/m2 NAD. Alert, oriented. TMs clear effusion. Pharynx clear. Neck supple with mild soft non tender adenopathy. Lungs clear. Heart RRR. Abdomen soft, non distended with mild epigastric area tenderness. Lower extremities no edema.   Assessment:   Problem List Items Addressed This Visit     Cardiovascular and Mediastinum   Essential hypertension, benign - Primary   Relevant Medications      lisinopril-hydrochlorothiazide (PRINZIDE,ZESTORETIC) 20-25 MG per tablet      rosuvastatin (CRESTOR) tablet   Other Relevant Orders      Lipid panel     Digestive   Gastritis    Other Visit Diagnoses   Encounter for long-term (current) use of medications        Relevant Orders       Hepatic function panel    Hyperlipemia        Relevant Medications       lisinopril-hydrochlorothiazide (PRINZIDE,ZESTORETIC) 20-25 MG per tablet       rosuvastatin (CRESTOR) tablet    Other Relevant Orders       Lipid panel    Need for vaccination        Relevant Orders       Td vaccine greater than or equal to 7yo preservative free IM (Completed)    Vasomotor rhinitis            Plan:  Meds ordered this encounter  Medications  . lisinopril-hydrochlorothiazide (PRINZIDE,ZESTORETIC) 20-25 MG per tablet    Sig: TAKE ONE TABLET BY MOUTH ONCE DAILY.    Dispense:  30 tablet    Refill:  5    Order Specific Question:  Supervising Provider    Answer:  Mikey Kirschner [[id number]]   . rosuvastatin (CRESTOR) 10 MG tablet    Sig: TAKE 1 TABLET DAILY FOR CHOLESTEROL.    Dispense:  30 tablet    Refill:  5    Order Specific Question:  Supervising Provider    Answer:  Mikey Kirschner [[id number]]   Restart Omeprazole as directed. Wean off caffeine. Given information on GERD. Call back if worsens or persists. OTC antihistamine Nasacort AQ as directed Zaditor eye drops  Return in about 6 months (around 03/11/2015).

## 2015-01-31 LAB — LIPID PANEL
Cholesterol: 158 mg/dL (ref 0–200)
HDL: 59 mg/dL (ref >=46)
LDL Cholesterol: 84 mg/dL (ref 0–99)
Total CHOL/HDL Ratio: 2.7 Ratio
Triglycerides: 77 mg/dL (ref <150)
VLDL: 15 mg/dL (ref 0–40)

## 2015-01-31 LAB — HEPATIC FUNCTION PANEL
ALT: 25 U/L (ref 0–35)
AST: 22 U/L (ref 0–37)
Albumin: 4.4 g/dL (ref 3.5–5.2)
Alkaline Phosphatase: 46 U/L (ref 39–117)
Bilirubin, Direct: 0.1 mg/dL (ref 0.0–0.3)
Indirect Bilirubin: 0.4 mg/dL (ref 0.2–1.2)
Total Bilirubin: 0.5 mg/dL (ref 0.2–1.2)
Total Protein: 6.5 g/dL (ref 6.0–8.3)

## 2015-03-15 ENCOUNTER — Other Ambulatory Visit: Payer: Self-pay | Admitting: Family Medicine

## 2015-03-15 DIAGNOSIS — Z1231 Encounter for screening mammogram for malignant neoplasm of breast: Secondary | ICD-10-CM

## 2015-03-20 ENCOUNTER — Ambulatory Visit (INDEPENDENT_AMBULATORY_CARE_PROVIDER_SITE_OTHER): Payer: Commercial Indemnity | Admitting: Nurse Practitioner

## 2015-03-20 ENCOUNTER — Encounter: Payer: Self-pay | Admitting: Nurse Practitioner

## 2015-03-20 VITALS — BP 132/86 | Ht 62.0 in | Wt 180.4 lb

## 2015-03-20 DIAGNOSIS — K2970 Gastritis, unspecified, without bleeding: Secondary | ICD-10-CM | POA: Diagnosis not present

## 2015-03-20 DIAGNOSIS — I10 Essential (primary) hypertension: Secondary | ICD-10-CM | POA: Diagnosis not present

## 2015-03-20 DIAGNOSIS — E785 Hyperlipidemia, unspecified: Secondary | ICD-10-CM

## 2015-03-20 MED ORDER — OMEPRAZOLE 20 MG PO CPDR: 20.0000 mg | DELAYED_RELEASE_CAPSULE | Freq: Every day | ORAL | Status: DC

## 2015-03-20 NOTE — Patient Instructions (Signed)
Soy Flaxseed Black cohosh Estroven

## 2015-03-20 NOTE — Progress Notes (Signed)
Subjective:  Presents for recheck. Having some epigastric area tenderness at times. No acid reflux. Nausea, no vomiting. Not taking Omeprazole. Drinking a large amount of green tea and lemon water. Non smoker. Occasional social alcohol. No excessive NSAID use. BMs normal. Colonoscopy UTD. No CP/ischemic type pain or SOB.   Objective:   BP 132/86 mmHg  Ht 5\' 2"  (1.575 m)  Wt 180 lb 6.4 oz (81.829 kg)  BMI 32.99 kg/m2 NAD. Alert, oriented. Lungs clear. Heart RRR. Abdomen soft, non distended, mild epigastric area tenderness. Reviewed labs from 3/14 with patient.   Assessment:  Problem List Items Addressed This Visit      Cardiovascular and Mediastinum   Essential hypertension, benign - Primary     Digestive   Gastritis     Other   Hyperlipidemia     Plan:  Meds ordered this encounter  Medications  . omeprazole (PRILOSEC) 20 MG capsule    Sig: Take 1 capsule (20 mg total) by mouth daily.    Dispense:  30 capsule    Refill:  11    Order Specific Question:  Supervising Provider    Answer:  Mikey Kirschner [[id number]]   Take Omeprazole daily for 2-3 weeks, call back at that time if symptoms persist. Decrease caffeine in diet.  Return in about 6 months (around 09/20/2015) for recheck.

## 2015-04-02 ENCOUNTER — Other Ambulatory Visit: Payer: Self-pay | Admitting: Nurse Practitioner

## 2015-04-03 ENCOUNTER — Ambulatory Visit (HOSPITAL_COMMUNITY)
Admission: RE | Admit: 2015-04-03 | Discharge: 2015-04-03 | Disposition: A | Discharge status: Home / Self Care | Payer: Managed Care, Other (non HMO) | Source: Ambulatory Visit | Attending: Family Medicine | Admitting: Family Medicine

## 2015-04-03 ENCOUNTER — Encounter: Payer: Self-pay | Admitting: Adult Health

## 2015-04-03 ENCOUNTER — Ambulatory Visit (INDEPENDENT_AMBULATORY_CARE_PROVIDER_SITE_OTHER): Payer: Commercial Indemnity | Admitting: Adult Health

## 2015-04-03 VITALS — BP 120/68 | HR 60 | Ht 63.0 in | Wt 177.5 lb

## 2015-04-03 DIAGNOSIS — Z01419 Encounter for gynecological examination (general) (routine) without abnormal findings: Secondary | ICD-10-CM | POA: Diagnosis not present

## 2015-04-03 DIAGNOSIS — Z1231 Encounter for screening mammogram for malignant neoplasm of breast: Secondary | ICD-10-CM | POA: Diagnosis not present

## 2015-04-03 DIAGNOSIS — R350 Frequency of micturition: Secondary | ICD-10-CM | POA: Diagnosis not present

## 2015-04-03 DIAGNOSIS — Z1212 Encounter for screening for malignant neoplasm of rectum: Secondary | ICD-10-CM | POA: Diagnosis not present

## 2015-04-03 LAB — HEMOCCULT GUIAC POC 1CARD (OFFICE): Fecal Occult Blood, POC: NEGATIVE

## 2015-04-03 LAB — POCT URINALYSIS DIPSTICK
Blood, UA: NEGATIVE
Glucose, UA: NEGATIVE
Leukocytes, UA: NEGATIVE
Nitrite, UA: NEGATIVE

## 2015-04-03 NOTE — Progress Notes (Signed)
Patient ID: Daisy Edwards, female   DOB: 1956/08/30, 59 y.o.   MRN: [id number] History of Present Illness: Daisy Edwards is a 59 year old black female, married in for well woman gyn exam, she is sp hysterectomy.she is having some urinary frequency today. PCP is C. Sumner Boast, [place].  Current Medications, Allergies, Past Medical History, Past Surgical History, Family History and Social History were reviewed in [employer] record.     Review of Systems: Patient denies any headaches, hearing loss, fatigue, blurred vision, shortness of breath, chest pain, abdominal pain, problems with bowel movements, urination, or intercourse. No mood swings.Has body aches.    Physical Exam:BP 120/68 mmHg  Pulse 60  Ht 5\' 3"  (1.6 m)  Wt 177 lb 8 oz (80.513 kg)  BMI 31.45 kg/m2 General:  Well developed, well nourished, no acute distress Skin:  Warm and dry Neck:  Midline trachea, normal thyroid, good ROM, no lymphadenopathy Lungs; Clear to auscultation bilaterally Breast:  No dominant palpable mass, retraction, or nipple discharge Cardiovascular: Regular rate and rhythm Abdomen:  Soft, non tender, no hepatosplenomegaly Pelvic:  External genitalia is normal in appearance, no lesions.  The vagina is normal in appearance. Urethra has no lesions or masses. The cervix and uterus are absent.  No adnexal masses or tenderness noted.Bladder is non tender, no masses felt. Rectal: Good sphincter tone, no polyps, internal hemorrhoids felt.  Hemoccult negative.+ mild rectocele Extremities/musculoskeletal:  No swelling or varicosities noted, no clubbing or cyanosis Psych:  No mood changes, alert and cooperative,seems happy   Impression: Well woman gyn exam no pap Urinary frequency    Plan: Check CBC,CMP,TSH(she lipids in March with PCP) UA C&S sent Physical in 1 year Mammogram yearly  Colonoscopy per GI Decrease caffeine increase water

## 2015-04-03 NOTE — Patient Instructions (Signed)
Physical in 1 year Mammogram yearly Colonoscopy per GI 

## 2015-04-04 ENCOUNTER — Telehealth: Payer: Self-pay | Admitting: Adult Health

## 2015-04-04 LAB — COMPREHENSIVE METABOLIC PANEL
ALT: 23 IU/L (ref 0–32)
AST: 21 IU/L (ref 0–40)
Albumin/Globulin Ratio: 2 (ref 1.1–2.5)
Albumin: 4.7 g/dL (ref 3.5–5.5)
Alkaline Phosphatase: 54 IU/L (ref 39–117)
BUN/Creatinine Ratio: 18 (ref 9–23)
BUN: 16 mg/dL (ref 6–24)
Bilirubin Total: 0.3 mg/dL (ref 0.0–1.2)
CO2: 25 mmol/L (ref 18–29)
Calcium: 9.6 mg/dL (ref 8.7–10.2)
Chloride: 101 mmol/L (ref 97–108)
Creatinine, Ser: 0.9 mg/dL (ref 0.57–1.00)
GFR calc Af Amer: 82 mL/min/{1.73_m2} (ref >59)
GFR calc non Af Amer: 71 mL/min/{1.73_m2} (ref >59)
Globulin, Total: 2.4 g/dL (ref 1.5–4.5)
Glucose: 86 mg/dL (ref 65–99)
Potassium: 4.1 mmol/L (ref 3.5–5.2)
Sodium: 144 mmol/L (ref 134–144)
Total Protein: 7.1 g/dL (ref 6.0–8.5)

## 2015-04-04 LAB — CBC
Hematocrit: 40.2 % (ref 34.0–46.6)
Hemoglobin: 13.4 g/dL (ref 11.1–15.9)
MCH: 26.7 pg (ref 26.6–33.0)
MCHC: 33.3 g/dL (ref 31.5–35.7)
MCV: 80 fL (ref 79–97)
Platelets: 220 10*3/uL (ref 150–379)
RBC: 5.02 x10E6/uL (ref 3.77–5.28)
RDW: 13.6 % (ref 12.3–15.4)
WBC: 5 10*3/uL (ref 3.4–10.8)

## 2015-04-04 LAB — URINALYSIS, ROUTINE W REFLEX MICROSCOPIC
Bilirubin, UA: NEGATIVE
Glucose, UA: NEGATIVE
Ketones, UA: NEGATIVE
Leukocytes, UA: NEGATIVE
Nitrite, UA: NEGATIVE
RBC, UA: NEGATIVE
Specific Gravity, UA: >=1.03 — AB (ref 1.005–1.030)
Urobilinogen, Ur: 0.2 mg/dL (ref 0.2–1.0)
pH, UA: 6 (ref 5.0–7.5)

## 2015-04-04 LAB — MICROSCOPIC EXAMINATION
Bacteria, UA: NONE SEEN
Casts: NONE SEEN /lpf

## 2015-04-04 LAB — TSH: TSH: 2.01 u[IU]/mL (ref 0.450–4.500)

## 2015-04-04 NOTE — Telephone Encounter (Signed)
Pt aware of labs and culture not back yet

## 2015-04-05 LAB — URINE CULTURE: Organism ID, Bacteria: NO GROWTH

## 2015-05-08 ENCOUNTER — Ambulatory Visit: Payer: Commercial Indemnity | Admitting: Family Medicine

## 2015-08-04 ENCOUNTER — Ambulatory Visit (INDEPENDENT_AMBULATORY_CARE_PROVIDER_SITE_OTHER): Payer: Commercial Indemnity | Admitting: Family Medicine

## 2015-08-04 ENCOUNTER — Encounter: Payer: Self-pay | Admitting: Family Medicine

## 2015-08-04 VITALS — BP 122/80 | Temp 99.1°F | Ht 64.0 in | Wt 179.0 lb

## 2015-08-04 DIAGNOSIS — R0600 Dyspnea, unspecified: Secondary | ICD-10-CM

## 2015-08-04 DIAGNOSIS — R42 Dizziness and giddiness: Secondary | ICD-10-CM

## 2015-08-04 DIAGNOSIS — L299 Pruritus, unspecified: Secondary | ICD-10-CM | POA: Diagnosis not present

## 2015-08-04 DIAGNOSIS — I10 Essential (primary) hypertension: Secondary | ICD-10-CM | POA: Diagnosis not present

## 2015-08-04 MED ORDER — DICLOFENAC SODIUM 75 MG PO TBEC: 75.0000 mg | DELAYED_RELEASE_TABLET | Freq: Two times a day (BID) | ORAL | Status: DC

## 2015-08-04 NOTE — Progress Notes (Signed)
   Subjective:    Patient ID: Daisy Edwards, female    DOB: July 08, 1956, 59 y.o.   MRN: [id number]  HPIdizziness, lightheaded, headaches mostly on right side of head. Started about one month ago. Off and on.  Takes ibuprofen for headaches.  Pt's temp today 99.1.  Shortness of breath off and on for the past 2 weeks. On further history he has had some anxiety. Tends to breathe quicker when anxious.  Bump on back that itches. Saw dermatologist. Pt states she was told it was nothing to worry about. Pt states that it is an external discomfort when the itch is. Started over 1 year ago. Derm reported nothing unusual and reported no need for concern   Early part of the day unbalanced and feeling dizzy  Not exrcising so hot   To the gym and some  Hits sharp no stecn   brnds over and feels it more, r sharp pain right-sided.   No cigarette exposure  Breathing the vapor from e-cigarette and doesn't like that    Review of Systems No fever no chills no chest pain no abdominal pain    Objective:   Physical Exam Alert vital stable HEENT normal neck supple scalp nontender to palpation lungs completely clear. Heart rare rhythm. Small nonspecific slightly pruritic region mid back with no noticeable dermatological abnormality and nonpalpable       Assessment & Plan:  Impression 1 neuropathic headache discussed s #2 dizziness potentially related to #3 #3 hypertension controlled good with Zestoretic. Patient may be expressing some transient lightheadedness secondary to blood pressure changes discussed #4 skin concerns with normal-appearing skin plan Voltaren when necessary for headache. After further discussion will keep blood pressure medicine the same because of good risk factor control. Follow-up as scheduled. WSL

## 2015-08-25 ENCOUNTER — Ambulatory Visit (INDEPENDENT_AMBULATORY_CARE_PROVIDER_SITE_OTHER): Payer: Commercial Indemnity | Admitting: Family Medicine

## 2015-08-25 ENCOUNTER — Encounter: Payer: Self-pay | Admitting: Family Medicine

## 2015-08-25 VITALS — BP 120/72 | Ht 64.0 in | Wt 182.0 lb

## 2015-08-25 DIAGNOSIS — Z79899 Other long term (current) drug therapy: Secondary | ICD-10-CM

## 2015-08-25 DIAGNOSIS — M7061 Trochanteric bursitis, right hip: Secondary | ICD-10-CM | POA: Diagnosis not present

## 2015-08-25 DIAGNOSIS — M542 Cervicalgia: Secondary | ICD-10-CM

## 2015-08-25 DIAGNOSIS — E785 Hyperlipidemia, unspecified: Secondary | ICD-10-CM | POA: Diagnosis not present

## 2015-08-25 DIAGNOSIS — I10 Essential (primary) hypertension: Secondary | ICD-10-CM | POA: Diagnosis not present

## 2015-08-25 MED ORDER — CHLORZOXAZONE 500 MG PO TABS: 500.0000 mg | ORAL_TABLET | Freq: Three times a day (TID) | ORAL | Status: DC | PRN

## 2015-08-25 NOTE — Progress Notes (Signed)
   Subjective:    Patient ID: Daisy Edwards, female    DOB: 11/02/1956, 59 y.o.   MRN: [id number] Patient arrives office with numerous concerns some new some old.   Hypertension This is a chronic problem. The current episode started more than 1 year ago. The problem has been gradually improving since onset. There are no associated agents to hypertension. There are no known risk factors for coronary artery disease. Treatments tried: lisinopril-hctz. The current treatment provides moderate improvement. There are no compliance problems.    Patient states that she is having a pain in the right side of her neck.  Right lat neck pain, feels not like a crock  Feels more like a cramp. Tight at times, sometimew quick pain Feels in arm at times  Feels more when stressed. Concerned about her shoulder discomfort because of history of cervical disc disease.  Some sob has calmed down. Notes less difficulty with this.  This started about 2 weeks ago.   Not feeling as lightnheaded ness. Dizziness has improved.  Ref [phone]  Patient is having pain in her right hip and down her right leg.right legged pain.. Worse with certain motions.  Some hx of right knee pain, no ortho visit in the past.  Now walking more,'  No sig sharp pain / Patient is taking diclofenac for this issue and it is not helping.    Review of Systems Headaches improved, no chest pain no abdominal pain no change in bowel habits    Objective:   Physical Exam  Alert vital stable mild malaise. Blood pressure good on repeat HEENT neck right lateral tenderness suprascapular tenderness to deep palpation. Distal arm strength sensation intact. Right leg hip tender with internal/external rotation lateral trochanteric tenderness also. Neuro exam intact      Assessment & Plan:  Impression 1 neuropathic headaches improved #2 right trochanteric bursitis new problem #3 worsening shoulder and neck pain with history of cervical disc  disease. #4 hypertension good control #5 lightheadedness improved plan orthopedic referral. Maintain same medications. Diet exercise discussed. Maintain diclofenac WSL addendum on Crestor for hyperlipidemia and no medication checked recently will need to do blood work

## 2015-08-29 ENCOUNTER — Other Ambulatory Visit: Payer: Self-pay | Admitting: Nurse Practitioner

## 2015-10-27 ENCOUNTER — Encounter: Payer: Self-pay | Admitting: Family Medicine

## 2015-10-27 LAB — HEPATIC FUNCTION PANEL
ALT: 27 IU/L (ref 0–32)
AST: 21 IU/L (ref 0–40)
Albumin: 4.8 g/dL (ref 3.5–5.5)
Alkaline Phosphatase: 57 IU/L (ref 39–117)
Bilirubin Total: 0.5 mg/dL (ref 0.0–1.2)
Bilirubin, Direct: 0.15 mg/dL (ref 0.00–0.40)
Total Protein: 7 g/dL (ref 6.0–8.5)

## 2015-10-27 LAB — BASIC METABOLIC PANEL
BUN/Creatinine Ratio: 15 (ref 9–23)
BUN: 12 mg/dL (ref 6–24)
CO2: 27 mmol/L (ref 18–29)
Calcium: 9.8 mg/dL (ref 8.7–10.2)
Chloride: 97 mmol/L (ref 97–106)
Creatinine, Ser: 0.82 mg/dL (ref 0.57–1.00)
GFR calc Af Amer: 91 mL/min/{1.73_m2} (ref >59)
GFR calc non Af Amer: 79 mL/min/{1.73_m2} (ref >59)
Glucose: 100 mg/dL — ABNORMAL HIGH (ref 65–99)
Potassium: 4 mmol/L (ref 3.5–5.2)
Sodium: 139 mmol/L (ref 136–144)

## 2015-10-27 LAB — LIPID PANEL
Chol/HDL Ratio: 2.8 ratio units (ref 0.0–4.4)
Cholesterol, Total: 174 mg/dL (ref 100–199)
HDL: 63 mg/dL (ref >39)
LDL Calculated: 91 mg/dL (ref 0–99)
Triglycerides: 99 mg/dL (ref 0–149)
VLDL Cholesterol Cal: 20 mg/dL (ref 5–40)

## 2016-02-26 ENCOUNTER — Other Ambulatory Visit: Payer: Self-pay | Admitting: Family Medicine

## 2016-02-26 DIAGNOSIS — Z1231 Encounter for screening mammogram for malignant neoplasm of breast: Secondary | ICD-10-CM

## 2016-04-03 ENCOUNTER — Other Ambulatory Visit: Payer: Self-pay | Admitting: Family Medicine

## 2016-04-03 DIAGNOSIS — Z1231 Encounter for screening mammogram for malignant neoplasm of breast: Secondary | ICD-10-CM

## 2016-04-05 ENCOUNTER — Ambulatory Visit (HOSPITAL_COMMUNITY): Payer: Self-pay

## 2016-04-08 ENCOUNTER — Ambulatory Visit (HOSPITAL_COMMUNITY)
Admission: RE | Admit: 2016-04-08 | Discharge: 2016-04-08 | Disposition: A | Discharge status: Home / Self Care | Payer: 59 | Source: Ambulatory Visit | Attending: Family Medicine | Admitting: Family Medicine

## 2016-04-08 ENCOUNTER — Ambulatory Visit (HOSPITAL_COMMUNITY): Payer: Self-pay

## 2016-04-08 DIAGNOSIS — Z1231 Encounter for screening mammogram for malignant neoplasm of breast: Secondary | ICD-10-CM | POA: Insufficient documentation

## 2016-04-11 ENCOUNTER — Other Ambulatory Visit: Payer: Self-pay | Admitting: Family Medicine

## 2016-04-11 DIAGNOSIS — R928 Other abnormal and inconclusive findings on diagnostic imaging of breast: Secondary | ICD-10-CM

## 2016-04-17 ENCOUNTER — Other Ambulatory Visit: Payer: Self-pay

## 2016-04-17 ENCOUNTER — Other Ambulatory Visit: Payer: Self-pay | Admitting: Family Medicine

## 2016-04-17 DIAGNOSIS — R928 Other abnormal and inconclusive findings on diagnostic imaging of breast: Secondary | ICD-10-CM

## 2016-04-18 ENCOUNTER — Ambulatory Visit
Admission: RE | Admit: 2016-04-18 | Discharge: 2016-04-18 | Disposition: A | Discharge status: Home / Self Care | Payer: 59 | Source: Ambulatory Visit | Attending: Family Medicine | Admitting: Family Medicine

## 2016-04-18 DIAGNOSIS — R928 Other abnormal and inconclusive findings on diagnostic imaging of breast: Secondary | ICD-10-CM

## 2016-04-24 ENCOUNTER — Encounter: Payer: Self-pay | Admitting: Family Medicine

## 2016-04-24 ENCOUNTER — Ambulatory Visit (INDEPENDENT_AMBULATORY_CARE_PROVIDER_SITE_OTHER): Payer: 59 | Admitting: Family Medicine

## 2016-04-24 VITALS — BP 142/80 | Ht 64.0 in | Wt 185.0 lb

## 2016-04-24 DIAGNOSIS — I10 Essential (primary) hypertension: Secondary | ICD-10-CM

## 2016-04-24 DIAGNOSIS — R42 Dizziness and giddiness: Secondary | ICD-10-CM | POA: Diagnosis not present

## 2016-04-24 DIAGNOSIS — Z139 Encounter for screening, unspecified: Secondary | ICD-10-CM

## 2016-04-24 DIAGNOSIS — Z79899 Other long term (current) drug therapy: Secondary | ICD-10-CM | POA: Diagnosis not present

## 2016-04-24 DIAGNOSIS — E785 Hyperlipidemia, unspecified: Secondary | ICD-10-CM | POA: Diagnosis not present

## 2016-04-24 MED ORDER — LISINOPRIL-HYDROCHLOROTHIAZIDE 20-25 MG PO TABS: 1.0000 | ORAL_TABLET | Freq: Every day | ORAL | Status: DC

## 2016-04-24 MED ORDER — ROSUVASTATIN CALCIUM 10 MG PO TABS: ORAL_TABLET | ORAL | Status: DC

## 2016-04-24 MED ORDER — OMEPRAZOLE 20 MG PO CPDR: 20.0000 mg | DELAYED_RELEASE_CAPSULE | Freq: Every day | ORAL | Status: DC

## 2016-04-24 NOTE — Progress Notes (Signed)
   Subjective:    Patient ID: Daisy Edwards, female    DOB: 1956-08-02, 60 y.o.   MRN: [id number]  Hyperlipidemia This is a chronic problem. The current episode started more than 1 year ago. Treatments tried: crestor. Compliance problems include adherence to exercise (eats healthy, not much exercise).   Compliant with lipid medications meds faithfully, does not miss a dose. No obvious side effect. Watching fats closely  Bp number generally good and stable . Good when checked elsewhere. 100% compliance with meds. No obvious side effects.  Beginning too walk some    Watching diet complian t  Mostly with a quick turn or standing quickly, oca speriodic   Few seconds No loss of consciousness. No headache.  History of slightly elevated glucose  Review of Systems No headache, no major weight loss or weight gain, no chest pain no back pain abdominal pain no change in bowel habits complete ROS otherwise negative     Objective:   Physical Exam  Alert vitals stable. HEENT normal. Neuro exam intact. Lungs clear. Heart regular in rhythm. Blood pressure excellent on repeat      Assessment & Plan:  Impression 1 hyperlipidemia. Prior levels reviewed. Meds refilled. Blood work ordered #2 hypertension good control meds reviewed to maintain same compliance discussed #3 transient in her ear dysfunction. Not disabling. Highly likely in her ear discussed at length symptom care discussed #4 impaired fasting glucose status uncertain plan appropriate blood work. Diet exercise discussed. Also questions answered regarding preventive interventions WSL

## 2016-05-08 ENCOUNTER — Other Ambulatory Visit: Payer: 59 | Admitting: Adult Health

## 2016-05-13 ENCOUNTER — Encounter: Payer: Self-pay | Admitting: Adult Health

## 2016-05-13 ENCOUNTER — Ambulatory Visit (INDEPENDENT_AMBULATORY_CARE_PROVIDER_SITE_OTHER): Payer: 59 | Admitting: Adult Health

## 2016-05-13 VITALS — BP 138/78 | HR 64 | Ht 63.5 in | Wt 189.0 lb

## 2016-05-13 DIAGNOSIS — Z01419 Encounter for gynecological examination (general) (routine) without abnormal findings: Secondary | ICD-10-CM | POA: Diagnosis not present

## 2016-05-13 DIAGNOSIS — Z1212 Encounter for screening for malignant neoplasm of rectum: Secondary | ICD-10-CM | POA: Diagnosis not present

## 2016-05-13 LAB — HEMOCCULT GUIAC POC 1CARD (OFFICE): Fecal Occult Blood, POC: NEGATIVE

## 2016-05-13 MED ORDER — CHOLECALCIFEROL 50 MCG (2000 UT) PO CAPS: ORAL_CAPSULE | ORAL | Status: DC

## 2016-05-13 NOTE — Progress Notes (Signed)
Patient ID: Daisy Edwards, female   DOB: 1956-02-22, 60 y.o.   MRN: [id number] History of Present Illness:  Daisy Edwards is a 60 year old black female in for a well woman gyn exam, she is sp hysterectomy. PCP is Dr Mickie Hillier.  Current Medications, Allergies, Past Medical History, Past Surgical History, Family History and Social History were reviewed in [employer] record.     Review of Systems: Patient denies any headaches, hearing loss, fatigue, blurred vision, shortness of breath, chest pain, abdominal pain, problems with bowel movements, urination, or intercourse. No joint pain or mood swings.   Physical Exam:BP 138/78 mmHg  Pulse 64  Ht 5' 3.5" (1.613 m)  Wt 189 lb (85.73 kg)  BMI 32.95 kg/m2 General:  Well developed, well nourished, no acute distress Skin:  Warm and dry Neck:  Midline trachea, normal thyroid, good ROM, no lymphadenopathy Lungs; Clear to auscultation bilaterally Breast:  No dominant palpable mass, retraction, or nipple discharge Cardiovascular: Regular rate and rhythm Abdomen:  Soft, non tender, no hepatosplenomegaly Pelvic:  External genitalia is normal in appearance, no lesions.  The vagina is normal in appearance. Urethra has no lesions or masses. The cervix and uterus are absent.  No adnexal masses or tenderness noted.Bladder is non tender, no masses felt. Rectal: Good sphincter tone, no polyps, or hemorrhoids felt.  Hemoccult negative. Extremities/musculoskeletal:  No swelling noted,has spider veins, no clubbing or cyanosis Psych:  No mood changes, alert and cooperative,seems happy   Impression:  Well woman gyn exam no pap   Plan: Physical in 1 year Mammogram yearly Labs with PCP Colonoscopy per GI

## 2016-05-13 NOTE — Patient Instructions (Signed)
Physical in 1 year Mammogram yearly Labs with PCP Take 2000 IU vitamin D 3 daily Colonoscopy per GI

## 2016-05-25 LAB — HEPATIC FUNCTION PANEL
ALT: 24 IU/L (ref 0–32)
AST: 18 IU/L (ref 0–40)
Albumin: 4.3 g/dL (ref 3.5–5.5)
Alkaline Phosphatase: 53 IU/L (ref 39–117)
Bilirubin Total: 0.3 mg/dL (ref 0.0–1.2)
Bilirubin, Direct: 0.13 mg/dL (ref 0.00–0.40)
Total Protein: 6.4 g/dL (ref 6.0–8.5)

## 2016-05-25 LAB — LIPID PANEL
Chol/HDL Ratio: 2.8 ratio units (ref 0.0–4.4)
Cholesterol, Total: 161 mg/dL (ref 100–199)
HDL: 57 mg/dL (ref >39)
LDL Calculated: 84 mg/dL (ref 0–99)
Triglycerides: 98 mg/dL (ref 0–149)
VLDL Cholesterol Cal: 20 mg/dL (ref 5–40)

## 2016-05-25 LAB — GLUCOSE, RANDOM: Glucose: 113 mg/dL — ABNORMAL HIGH (ref 65–99)

## 2016-05-27 ENCOUNTER — Encounter: Payer: Self-pay | Admitting: Family Medicine

## 2016-09-26 ENCOUNTER — Other Ambulatory Visit: Payer: Self-pay | Admitting: Family Medicine

## 2016-10-25 ENCOUNTER — Other Ambulatory Visit: Payer: Self-pay | Admitting: Family Medicine

## 2016-11-08 ENCOUNTER — Encounter: Payer: Self-pay | Admitting: Family Medicine

## 2016-11-08 ENCOUNTER — Ambulatory Visit (INDEPENDENT_AMBULATORY_CARE_PROVIDER_SITE_OTHER): Payer: 59 | Admitting: Family Medicine

## 2016-11-08 VITALS — BP 132/88 | Ht 64.0 in | Wt 186.0 lb

## 2016-11-08 DIAGNOSIS — E785 Hyperlipidemia, unspecified: Secondary | ICD-10-CM | POA: Diagnosis not present

## 2016-11-08 DIAGNOSIS — I10 Essential (primary) hypertension: Secondary | ICD-10-CM | POA: Diagnosis not present

## 2016-11-08 DIAGNOSIS — R079 Chest pain, unspecified: Secondary | ICD-10-CM | POA: Diagnosis not present

## 2016-11-08 DIAGNOSIS — Z79899 Other long term (current) drug therapy: Secondary | ICD-10-CM | POA: Diagnosis not present

## 2016-11-08 MED ORDER — LISINOPRIL-HYDROCHLOROTHIAZIDE 20-25 MG PO TABS: 1.0000 | ORAL_TABLET | Freq: Every day | ORAL | 5 refills | Status: DC

## 2016-11-08 MED ORDER — ROSUVASTATIN CALCIUM 10 MG PO TABS: 10.0000 mg | ORAL_TABLET | Freq: Every day | ORAL | 5 refills | Status: DC

## 2016-11-08 NOTE — Progress Notes (Signed)
   Subjective:    Patient ID: Daisy Edwards, female    DOB: [birth year], 60 y.o.   MRN: [id number]  Hypertension  This is a chronic problem. Treatments tried: lisinopril-hctz.   Tightness in chest and sob for the past month.   Little tightnes in the chest  Pt ha cold and cong  Around the same time came on  Walks couple blocks two to thre d   No sig wheezinge  Not exercising too much now  This wk notes tightness in the chest, more so when busy,not necessarily when walkin, little trouble with breathing, breaks out in sweat   Smoked for two yrs in past  135 ver 75  Trying to watch diet these days   Day [phone]  Pt's father had heart attack, fa smoked      Review of Systems No headache, no major weight loss or weight gain, no chest pain no back pain abdominal pain no change in bowel habits complete ROS otherwise negative     Objective:   Physical Exam Alert vitals stable, NAD. Blood pressure good on repeat. HEENT normal. Lungs clear. Heart regular rate and rhythm.   EKG some artifact but normal sinus rhythm no substantial ST-T changes       Assessment & Plan:  Impression atypical chest pain discussed at length. Chance of this being cardiac is very low. No need for emergent workup. However with some risk factors and atypical nature cardiology referral warranted discussed at length. #2 hypertension good control discussed maintain #3 hyperlipidemia status uncertain prior blood work reviewed to maintain same plan diet exercise discussed. Cardiology referral. Appropriate blood work medications refilled

## 2016-11-09 LAB — HEPATIC FUNCTION PANEL
ALT: 25 IU/L (ref 0–32)
AST: 23 IU/L (ref 0–40)
Albumin: 4.9 g/dL (ref 3.5–5.5)
Alkaline Phosphatase: 59 IU/L (ref 39–117)
Bilirubin Total: 0.4 mg/dL (ref 0.0–1.2)
Bilirubin, Direct: 0.13 mg/dL (ref 0.00–0.40)
Total Protein: 7.3 g/dL (ref 6.0–8.5)

## 2016-11-09 LAB — BASIC METABOLIC PANEL
BUN/Creatinine Ratio: 24 — ABNORMAL HIGH (ref 9–23)
BUN: 18 mg/dL (ref 6–24)
CO2: 25 mmol/L (ref 18–29)
Calcium: 9.4 mg/dL (ref 8.7–10.2)
Chloride: 100 mmol/L (ref 96–106)
Creatinine, Ser: 0.74 mg/dL (ref 0.57–1.00)
GFR calc Af Amer: 103 mL/min/{1.73_m2} (ref >59)
GFR calc non Af Amer: 89 mL/min/{1.73_m2} (ref >59)
Glucose: 96 mg/dL (ref 65–99)
Potassium: 4.1 mmol/L (ref 3.5–5.2)
Sodium: 142 mmol/L (ref 134–144)

## 2016-11-09 LAB — LIPID PANEL
Chol/HDL Ratio: 2.8 ratio units (ref 0.0–4.4)
Cholesterol, Total: 177 mg/dL (ref 100–199)
HDL: 63 mg/dL (ref >39)
LDL Calculated: 96 mg/dL (ref 0–99)
Triglycerides: 92 mg/dL (ref 0–149)
VLDL Cholesterol Cal: 18 mg/dL (ref 5–40)

## 2016-11-13 ENCOUNTER — Encounter: Payer: Self-pay | Admitting: Family Medicine

## 2016-11-14 ENCOUNTER — Encounter: Payer: Self-pay | Admitting: Family Medicine

## 2016-11-15 ENCOUNTER — Encounter: Payer: Self-pay | Admitting: Family Medicine

## 2016-12-04 ENCOUNTER — Ambulatory Visit: Payer: Self-pay | Admitting: Cardiology

## 2016-12-17 ENCOUNTER — Encounter: Payer: Self-pay | Admitting: Cardiology

## 2016-12-17 NOTE — Progress Notes (Signed)
Cardiology Office Note  Date: 12/18/2016   ID: REUBEN WINER, DOB 1956/01/17, MRN [id number]  PCP: Mickie Hillier, MD  Consulting Cardiologist: Rozann Lesches, MD   Chief Complaint  Patient presents with  . Chest Pain    History of Present Illness: Daisy Edwards is a 61 y.o. female referred for cardiology consultation by Dr. Wolfgang Phoenix for the evaluation of chest discomfort. She states that she has noticed occasional feeling of tightness in her chest, thought that it might have been reflux, but has experienced it with activity sometimes. She works in [employer] and states that her job is fairly busy, she has had some symptoms with that. In talking with her it sounds like she has had intermittent symptoms over the years that are similar. Since December however she has had improvement in symptoms.  Records indicate previous cardiology evaluation by Dr. Debara Pickett back in 2014. She underwent cardiometabolic testing at that time which was felt to point toward the possibility of small vessel ischemia and exercise-induced diastolic dysfunction. She does not have a history of obstructive CAD.  I reviewed her medications which are outlined below. She reports no recent changes. Last LDL was 96 on Crestor.  Past Medical History:  Diagnosis Date  . Anemia   . Anxiety    Panic attack in past  . Arthritis    Cervical stenosis, spondylosis   . Back pain, chronic   . Carpal tunnel syndrome on both sides    Right > left  . Cervical vertebral fusion   . Chronic leg pain   . Elevated cholesterol   . Essential hypertension   . Hyperlipidemia   . Impaired fasting glucose     Past Surgical History:  Procedure Laterality Date  . ABDOMINAL HYSTERECTOMY    . ANTERIOR CERVICAL DECOMP/DISCECTOMY FUSION  01/29/2012   Procedure: ANTERIOR CERVICAL DECOMPRESSION/DISCECTOMY FUSION 2 LEVELS;  Surgeon: Marybelle Killings, MD;  Location: [place];  Service: Orthopedics;  Laterality: N/A;  C3-4, C4-5 Anterior Cervical  Discectomy and Fusion, allograft, plate  . CARDIAC CATHETERIZATION     prior to 2008, not sure where it was done, result- [facility]   . CHOLECYSTECTOMY     [facility]  . COLONOSCOPY    . VEIN SURGERY      Current Outpatient Prescriptions  Medication Sig Dispense Refill  . aspirin 81 MG tablet Take 81 mg by mouth daily.    . Cholecalciferol 2000 units CAPS Take 1 daily 30 each   . lisinopril-hydrochlorothiazide (PRINZIDE,ZESTORETIC) 20-25 MG tablet Take 1 tablet by mouth daily. 30 tablet 5  . Multiple Vitamin (MULITIVITAMIN WITH MINERALS) TABS Take 1 tablet by mouth daily.    . Omega-3 Fatty Acids (FISH OIL TRIPLE STRENGTH PO) Take 1,500 mg by mouth daily.     Marland Kitchen omeprazole (PRILOSEC) 20 MG capsule Take 1 capsule (20 mg total) by mouth daily. (Patient taking differently: Take 20 mg by mouth as needed. ) 30 capsule 5  . rosuvastatin (CRESTOR) 10 MG tablet Take 1 tablet (10 mg total) by mouth daily. for cholesterol 30 tablet 5   No current facility-administered medications for this visit.    Allergies:  Patient has no known allergies.   Social History: The patient  reports that she has quit smoking. Her smoking use included Cigarettes. She quit after 0.50 years of use. She has never used smokeless tobacco. She reports that she drinks about 2.4 oz of alcohol per week . She reports that she does not use drugs.  Family History: The patient's family history includes Aneurysm in her mother; Cancer in her maternal aunt and maternal grandmother; Diabetes in her maternal aunt and maternal aunt; Heart attack in her father; Heart disease in her father; Hypertension in her mother.   ROS:  Please see the history of present illness. Otherwise, complete review of systems is positive for none.  All other systems are reviewed and negative.   Physical Exam: VS:  BP 140/72 (BP Location: Left Arm)   Pulse 76   Ht 5\' 3"  (1.6 m)   Wt 191 lb (86.6 kg)   SpO2 98%   BMI 33.83 kg/m , BMI Body mass index is 33.83  kg/m.  Wt Readings from Last 3 Encounters:  12/18/16 191 lb (86.6 kg)  11/08/16 186 lb (84.4 kg)  05/13/16 189 lb (85.7 kg)    General: Overweight woman, appears comfortable at rest. HEENT: Conjunctiva and lids normal, oropharynx clear. Neck: Supple, no elevated JVP or carotid bruits, no thyromegaly. Lungs: Clear to auscultation, nonlabored breathing at rest. Cardiac: Regular rate and rhythm, no S3 or significant systolic murmur, no pericardial rub. Abdomen: Soft, nontender, bowel sounds present, no guarding or rebound. Extremities: No pitting edema, distal pulses 2+. Skin: Warm and dry. Musculoskeletal: No kyphosis. Neuropsychiatric: Alert and oriented x3, affect grossly appropriate.  ECG: I personally reviewed the tracing from 03/01/2013 which showed sinus bradycardia.  Recent Labwork: 11/08/2016: ALT 25; AST 23; BUN 18; Creatinine, Ser 0.74; Potassium 4.1; Sodium 142     Component Value Date/Time   CHOL 177 11/08/2016 1452   TRIG 92 11/08/2016 1452   HDL 63 11/08/2016 1452   CHOLHDL 2.8 11/08/2016 1452   CHOLHDL 2.7 01/30/2015 1429   VLDL 15 01/30/2015 1429   LDLCALC 96 11/08/2016 1452    Other Studies Reviewed Today:  Chest x-ray 01/24/2012: Findings: Normal heart size.  Normal vascularity.  Lungs are clear without infiltrate or mass.  IMPRESSION: Negative  Assessment and Plan:  1. Intermittent chest discomfort, possibly anginal. There is question of potential small vessel ischemic disease based on prior cardiopulmonary stress testing with Dr. Debara Pickett. She has not had any recent ischemic assessment and we discussed arranging an exercise echocardiogram to make sure that there has been no substantial increase in ischemic burden. Medical regimen currently includes aspirin and statin.  2. Hyperlipidemia, on Crestor. Last LDL was 96.  3. Essential hypertension, on Prinzide.  4. GERD, she uses Prilosec intermittently.  Current medicines were reviewed with the patient  today.   Orders Placed This Encounter  Procedures  . ECHOCARDIOGRAM STRESS TEST    Disposition: Call with test results.  Signed, Satira Sark, MD, [facility] 12/18/2016 1:19 PM    [facility] [address] [place], [place] [address] Phone: [phone]; Fax: [phone] \

## 2016-12-18 ENCOUNTER — Ambulatory Visit: Payer: Self-pay | Admitting: Cardiology

## 2016-12-18 ENCOUNTER — Ambulatory Visit (INDEPENDENT_AMBULATORY_CARE_PROVIDER_SITE_OTHER): Payer: 59 | Admitting: Cardiology

## 2016-12-18 ENCOUNTER — Encounter: Payer: Self-pay | Admitting: Cardiology

## 2016-12-18 VITALS — BP 140/72 | HR 76 | Ht 63.0 in | Wt 191.0 lb

## 2016-12-18 DIAGNOSIS — I208 Other forms of angina pectoris: Secondary | ICD-10-CM

## 2016-12-18 DIAGNOSIS — I2089 Other forms of angina pectoris: Secondary | ICD-10-CM

## 2016-12-18 DIAGNOSIS — R072 Precordial pain: Secondary | ICD-10-CM | POA: Diagnosis not present

## 2016-12-18 DIAGNOSIS — I10 Essential (primary) hypertension: Secondary | ICD-10-CM

## 2016-12-18 DIAGNOSIS — K219 Gastro-esophageal reflux disease without esophagitis: Secondary | ICD-10-CM

## 2016-12-18 DIAGNOSIS — E782 Mixed hyperlipidemia: Secondary | ICD-10-CM

## 2016-12-18 NOTE — Patient Instructions (Signed)
Medication Instructions:  Your physician recommends that you continue on your current medications as directed. Please refer to the Current Medication list given to you today.  Labwork: none  Testing/Procedures: Your physician has requested that you have a stress echocardiogram. For further information please visit [email or web address]. Please follow instruction sheet as given.    Follow-Up: Your physician recommends that you schedule a follow-up appointment in: to be determined (we will call with results)   Any Other Special Instructions Will Be Listed Below (If Applicable).     If you need a refill on your cardiac medications before your next appointment, please call your pharmacy.

## 2016-12-30 ENCOUNTER — Ambulatory Visit (HOSPITAL_COMMUNITY)
Admission: RE | Admit: 2016-12-30 | Discharge: 2016-12-30 | Disposition: A | Discharge status: Home / Self Care | Payer: 59 | Source: Ambulatory Visit | Attending: Cardiology | Admitting: Cardiology

## 2016-12-30 DIAGNOSIS — R9431 Abnormal electrocardiogram [ECG] [EKG]: Secondary | ICD-10-CM | POA: Diagnosis not present

## 2016-12-30 DIAGNOSIS — I208 Other forms of angina pectoris: Secondary | ICD-10-CM | POA: Diagnosis not present

## 2016-12-30 LAB — ECHOCARDIOGRAM STRESS TEST
Estimated workload: 10.1 METS
Exercise duration (min): 9 min
Exercise duration (sec): 0 s
MPHR: 161 {beats}/min
Peak HR: 153 {beats}/min
Percent HR: 95 %
RPE: 14
Rest HR: 68 {beats}/min

## 2016-12-30 NOTE — Progress Notes (Signed)
*  PRELIMINARY RESULTS* Echocardiogram 2D Echocardiogram has been performed.  Daisy Edwards 12/30/2016, 10:20 AM

## 2016-12-31 ENCOUNTER — Telehealth: Payer: Self-pay

## 2016-12-31 MED ORDER — RANOLAZINE ER 500 MG PO TB12: 500.0000 mg | ORAL_TABLET | Freq: Two times a day (BID) | ORAL | 3 refills | Status: DC

## 2016-12-31 NOTE — Telephone Encounter (Signed)
Pt given 2 week sample ranexa lot [id number], exp 02/2019, she will call back if she tolerates med and we will then send rx to [facility]

## 2016-12-31 NOTE — Telephone Encounter (Signed)
-----   Message from Satira Sark, MD sent at 12/30/2016  3:27 PM EST ----- Results reviewed. Most reassuring part of this study is the echocardiographic finding showing hyperdynamic contraction without wall motion abnormalities to indicate clear obstructive CAD. The ECG abnormalities are less specific in this instance. Based on this testing and previous workup, it may be worth considering Ranexa for management of endovascular angina or small vessel disease. Could try the 500 mg twice daily dose if her cost is reasonable. A copy of this test should be forwarded to Mickie Hillier, MD.

## 2017-01-10 ENCOUNTER — Other Ambulatory Visit: Payer: Self-pay | Admitting: Cardiology

## 2017-01-10 MED ORDER — RANOLAZINE ER 500 MG PO TB12
500.0000 mg | ORAL_TABLET | Freq: Two times a day (BID) | ORAL | 3 refills | Status: DC
Start: 2017-01-10 — End: 2017-03-10

## 2017-01-10 NOTE — Telephone Encounter (Signed)
Needs Ranexa RX sent in to [facility] / tg

## 2017-01-10 NOTE — Telephone Encounter (Signed)
Refill completed.

## 2017-03-10 ENCOUNTER — Telehealth: Payer: Self-pay | Admitting: Family Medicine

## 2017-03-10 ENCOUNTER — Other Ambulatory Visit: Payer: Self-pay | Admitting: *Deleted

## 2017-03-10 ENCOUNTER — Telehealth: Payer: Self-pay | Admitting: Cardiology

## 2017-03-10 MED ORDER — ROSUVASTATIN CALCIUM 10 MG PO TABS: 10.0000 mg | ORAL_TABLET | Freq: Every day | ORAL | 2 refills | Status: DC

## 2017-03-10 MED ORDER — RANOLAZINE ER 500 MG PO TB12: 500.0000 mg | ORAL_TABLET | Freq: Two times a day (BID) | ORAL | 3 refills | Status: DC

## 2017-03-10 MED ORDER — LISINOPRIL-HYDROCHLOROTHIAZIDE 20-25 MG PO TABS: 1.0000 | ORAL_TABLET | Freq: Every day | ORAL | 2 refills | Status: DC

## 2017-03-10 NOTE — Telephone Encounter (Signed)
Patient states that she is going out of town for 6 wks and wants to make sure she has enough medication. / tg

## 2017-03-10 NOTE — Telephone Encounter (Signed)
No to ranexa, yes to others for three mo total

## 2017-03-10 NOTE — Telephone Encounter (Signed)
We don't prescribe the Ranexa. Is it ok to refill along with the rosuvastatin and lisinopril?

## 2017-03-10 NOTE — Telephone Encounter (Signed)
Discussed with pt. Lisinopril and crestor sent to pharm. Other med needs to come from specialist. Pt verbalized understanding.

## 2017-03-10 NOTE — Telephone Encounter (Signed)
Patient is going out of town for job training for the next 6 weeks.  She is requesting Rx to cover her for rosuvastatin, lisinopril and Renexa.  [employer]

## 2017-04-09 ENCOUNTER — Telehealth: Payer: Self-pay | Admitting: Family Medicine

## 2017-04-09 NOTE — Telephone Encounter (Signed)
Pt is requesting 90 day refills on rosuvastatin (CRESTOR) 10 MG tablet lisinopril-hydrochlorothiazide (PRINZIDE,ZESTORETIC) 20-25 MG tablet   [facility]

## 2017-04-09 NOTE — Telephone Encounter (Signed)
We've managed in past, ok times one as long as I have appt with pt in June as redc in dec, times one no ref for ea, , also rec lip liv before that visit

## 2017-04-09 NOTE — Telephone Encounter (Signed)
Last seen here for check up in December. Both meds were last prescribed by Dr. Rozann Lesches in april

## 2017-04-09 NOTE — Telephone Encounter (Signed)
Left message to return call 

## 2017-04-10 ENCOUNTER — Other Ambulatory Visit: Payer: Self-pay | Admitting: *Deleted

## 2017-04-10 DIAGNOSIS — E785 Hyperlipidemia, unspecified: Secondary | ICD-10-CM

## 2017-04-10 DIAGNOSIS — Z79899 Other long term (current) drug therapy: Secondary | ICD-10-CM

## 2017-04-10 MED ORDER — ROSUVASTATIN CALCIUM 10 MG PO TABS: 10.0000 mg | ORAL_TABLET | Freq: Every day | ORAL | 0 refills | Status: DC

## 2017-04-10 MED ORDER — LISINOPRIL-HYDROCHLOROTHIAZIDE 20-25 MG PO TABS: 1.0000 | ORAL_TABLET | Freq: Every day | ORAL | 0 refills | Status: DC

## 2017-04-10 NOTE — Telephone Encounter (Signed)
Discussed with pt. Pt verbalized understanding. She will call back to schedule follow up office visit in June. Orders put in. Pt notified to do bloodwork one week before appt in June. meds sent to pharm.

## 2017-06-19 ENCOUNTER — Other Ambulatory Visit: Payer: Self-pay | Admitting: Family Medicine

## 2017-06-19 DIAGNOSIS — Z1231 Encounter for screening mammogram for malignant neoplasm of breast: Secondary | ICD-10-CM

## 2017-06-26 ENCOUNTER — Telehealth: Payer: Self-pay | Admitting: Adult Health

## 2017-06-27 NOTE — Telephone Encounter (Signed)
LMOVM that 2D is normally ordered but 3D is performed if there is a callback from previous u/s. Informed that there is a $50 facility for the 3D but no one at [facility] radiology knew if it was covered. Advised to call radiology if she had further questions.

## 2017-06-30 ENCOUNTER — Ambulatory Visit (HOSPITAL_COMMUNITY)
Admission: RE | Admit: 2017-06-30 | Discharge: 2017-06-30 | Disposition: A | Discharge status: Home / Self Care | Payer: Managed Care, Other (non HMO) | Source: Ambulatory Visit | Attending: Family Medicine | Admitting: Family Medicine

## 2017-06-30 DIAGNOSIS — Z1231 Encounter for screening mammogram for malignant neoplasm of breast: Secondary | ICD-10-CM | POA: Diagnosis not present

## 2017-07-07 ENCOUNTER — Other Ambulatory Visit: Payer: Self-pay | Admitting: Family Medicine

## 2017-07-08 ENCOUNTER — Other Ambulatory Visit: Payer: 59 | Admitting: Adult Health

## 2017-07-16 ENCOUNTER — Telehealth: Payer: Self-pay | Admitting: Family Medicine

## 2017-07-16 ENCOUNTER — Ambulatory Visit (INDEPENDENT_AMBULATORY_CARE_PROVIDER_SITE_OTHER): Payer: 59 | Admitting: Adult Health

## 2017-07-16 ENCOUNTER — Encounter: Payer: Self-pay | Admitting: Adult Health

## 2017-07-16 VITALS — BP 110/80 | HR 76 | Ht 62.0 in | Wt 186.0 lb

## 2017-07-16 DIAGNOSIS — Z01419 Encounter for gynecological examination (general) (routine) without abnormal findings: Secondary | ICD-10-CM | POA: Insufficient documentation

## 2017-07-16 DIAGNOSIS — I10 Essential (primary) hypertension: Secondary | ICD-10-CM

## 2017-07-16 DIAGNOSIS — Z1212 Encounter for screening for malignant neoplasm of rectum: Secondary | ICD-10-CM | POA: Diagnosis not present

## 2017-07-16 DIAGNOSIS — Z131 Encounter for screening for diabetes mellitus: Secondary | ICD-10-CM

## 2017-07-16 DIAGNOSIS — Z1211 Encounter for screening for malignant neoplasm of colon: Secondary | ICD-10-CM | POA: Diagnosis not present

## 2017-07-16 DIAGNOSIS — E785 Hyperlipidemia, unspecified: Secondary | ICD-10-CM

## 2017-07-16 LAB — HEMOCCULT GUIAC POC 1CARD (OFFICE): Fecal Occult Blood, POC: NEGATIVE

## 2017-07-16 NOTE — Progress Notes (Signed)
Patient ID: Daisy Edwards, female   DOB: 09-21-1956, 61 y.o.   MRN: [id number] History of Present Illness: Daisy Edwards is a 61 year old black female, sp hysterectomy in for well woman gyn exam. PCP is Dr Wolfgang Phoenix.   Current Medications, Allergies, Past Medical History, Past Surgical History, Family History and Social History were reviewed in [employer] record.     Review of Systems: Patient denies any headaches, hearing loss, fatigue, blurred vision, shortness of breath, chest pain, abdominal pain(has pain RLQ at times), problems with bowel movements, urination, or intercourse. No joint pain or mood swings.    Physical Exam:BP 110/80 (BP Location: Right Arm, Patient Position: Sitting, Cuff Size: Small)   Pulse 76   Ht 5\' 2"  (1.575 m)   Wt 186 lb (84.4 kg)   BMI 34.02 kg/m  General:  Well developed, well nourished, no acute distress Skin:  Warm and dry Neck:  Midline trachea, normal thyroid, good ROM, no lymphadenopathy,no carotid bruits heard  Lungs; Clear to auscultation bilaterally Breast:  No dominant palpable mass, retraction, or nipple discharge Cardiovascular: Regular rate and rhythm Abdomen:  Soft, non tender, no hepatosplenomegaly Pelvic:  External genitalia is normal in appearance, no lesions.  The vagina is normal in appearance. Urethra has no lesions or masses. The cervix and uterus are absent. No adnexal masses or tenderness noted.Bladder is non tender, no masses felt. Rectal: Good sphincter tone, no polyps, or hemorrhoids felt.  Hemoccult negative. Extremities/musculoskeletal:  No swelling or varicosities noted, no clubbing or cyanosis Psych:  No mood changes, alert and cooperative,seems happy PHQ 2 score 0.  Impression: 1. Well woman exam with routine gynecological exam   2. Screening for colorectal cancer       Plan: Physical in 1 year Mammogram yearly Labs with PCP Colonoscopy per GI

## 2017-07-16 NOTE — Telephone Encounter (Signed)
Patient had lipid, liver and met 7- 10/2016 

## 2017-07-16 NOTE — Telephone Encounter (Signed)
Lip liv glu 

## 2017-07-16 NOTE — Telephone Encounter (Signed)
Pt is needing lab orders sent over for an upcoming appt with Dr. Richardson Landry 9/14.

## 2017-07-16 NOTE — Patient Instructions (Signed)
Physical in 1 year 

## 2017-07-17 NOTE — Telephone Encounter (Signed)
Blood work ordered in EPIC. Patient notified. 

## 2017-07-28 ENCOUNTER — Telehealth: Payer: Self-pay | Admitting: Gastroenterology

## 2017-07-28 NOTE — Telephone Encounter (Signed)
Letter mailed to pt.  

## 2017-07-28 NOTE — Telephone Encounter (Signed)
RECALL FOR TCS °

## 2017-07-29 LAB — HEPATIC FUNCTION PANEL
ALT: 24 IU/L (ref 0–32)
AST: 18 IU/L (ref 0–40)
Albumin: 4.8 g/dL (ref 3.6–4.8)
Alkaline Phosphatase: 56 IU/L (ref 39–117)
Bilirubin Total: 0.5 mg/dL (ref 0.0–1.2)
Bilirubin, Direct: 0.15 mg/dL (ref 0.00–0.40)
Total Protein: 7 g/dL (ref 6.0–8.5)

## 2017-07-29 LAB — GLUCOSE, RANDOM: Glucose: 90 mg/dL (ref 65–99)

## 2017-07-29 LAB — LIPID PANEL
Chol/HDL Ratio: 2.8 ratio (ref 0.0–4.4)
Cholesterol, Total: 170 mg/dL (ref 100–199)
HDL: 60 mg/dL (ref >39)
LDL Calculated: 88 mg/dL (ref 0–99)
Triglycerides: 110 mg/dL (ref 0–149)
VLDL Cholesterol Cal: 22 mg/dL (ref 5–40)

## 2017-08-01 ENCOUNTER — Ambulatory Visit (INDEPENDENT_AMBULATORY_CARE_PROVIDER_SITE_OTHER): Payer: 59 | Admitting: Family Medicine

## 2017-08-01 ENCOUNTER — Encounter: Payer: Self-pay | Admitting: Family Medicine

## 2017-08-01 VITALS — BP 132/76 | Ht 64.0 in | Wt 184.0 lb

## 2017-08-01 DIAGNOSIS — Z23 Encounter for immunization: Secondary | ICD-10-CM

## 2017-08-01 DIAGNOSIS — E785 Hyperlipidemia, unspecified: Secondary | ICD-10-CM | POA: Diagnosis not present

## 2017-08-01 DIAGNOSIS — I10 Essential (primary) hypertension: Secondary | ICD-10-CM

## 2017-08-01 DIAGNOSIS — S29019A Strain of muscle and tendon of unspecified wall of thorax, initial encounter: Secondary | ICD-10-CM

## 2017-08-01 MED ORDER — ROSUVASTATIN CALCIUM 10 MG PO TABS: 10.0000 mg | ORAL_TABLET | Freq: Every day | ORAL | 5 refills | Status: DC

## 2017-08-01 MED ORDER — OMEPRAZOLE 20 MG PO CPDR: 20.0000 mg | DELAYED_RELEASE_CAPSULE | Freq: Every day | ORAL | 5 refills | Status: DC

## 2017-08-01 MED ORDER — LISINOPRIL-HYDROCHLOROTHIAZIDE 20-25 MG PO TABS: 1.0000 | ORAL_TABLET | Freq: Every day | ORAL | 5 refills | Status: DC

## 2017-08-01 NOTE — Progress Notes (Signed)
   Subjective:    Patient ID: Daisy Edwards, female    DOB: [birth year], 61 y.o.   MRN: [id number] Patient arrives with numerous concerns Hypertension  This is a chronic problem. The current episode started more than 1 year ago.   Results for orders placed or performed in visit on 07/16/17  Lipid panel  Result Value Ref Range   Cholesterol, Total 170 100 - 199 mg/dL   Triglycerides 110 0 - 149 mg/dL   HDL 60 >39 mg/dL   VLDL Cholesterol Cal 22 5 - 40 mg/dL   LDL Calculated 88 0 - 99 mg/dL   Chol/HDL Ratio 2.8 0.0 - 4.4 ratio  Hepatic function panel  Result Value Ref Range   Total Protein 7.0 6.0 - 8.5 g/dL   Albumin 4.8 3.6 - 4.8 g/dL   Bilirubin Total 0.5 0.0 - 1.2 mg/dL   Bilirubin, Direct 0.15 0.00 - 0.40 mg/dL   Alkaline Phosphatase 56 39 - 117 IU/L   AST 18 0 - 40 IU/L   ALT 24 0 - 32 IU/L  Glucose, random  Result Value Ref Range   Glucose 90 65 - 99 mg/dL    reflux overall stable, rarely uses prilosic, not very often   Flu shot today    Pt is walking around three tines per wk   Blood pressure medicine and blood pressure levels reviewed today with patient. Compliant with blood pressure medicine. States does not miss a dose. No obvious side effects. Blood pressure generally good when checked elsewhere. Watching salt intake.  Patient continues to take lipid medication regularly. No obvious side effects from it. Generally does not miss a dose. Prior blood work results are reviewed with patient. Patient continues to work on fat intake in diet   No sig new problems just a few aches and pains  Right post thorax pain, sharp, worse with motion     Patient states no concerns this visit.   Review of Systems No headache, no major weight loss or weight gain, no chest pain no back pain abdominal pain no change in bowel habits complete ROS otherwise negative     Objective:   Physical Exam Alert and oriented, vitals reviewed and stable, NAD ENT-TM's and ext canals WNL  bilat via otoscopic exam Soft palate, tonsils and post pharynx WNL via oropharyngeal exam Neck-symmetric, no masses; thyroid nonpalpable and nontender Pulmonary-no tachypnea or accessory muscle use; Clear without wheezes via auscultation Card--no abnrml murmurs, rhythm reg and rate WNL Carotid pulses symmetric, without bruits No discrete flank tenderness no spinal tenderness no chest wall pain or tenderness       Assessment & Plan:  Impression 1 hypertension discussed compliance discussed good control to maintain same  #2 hyperlipidemia prior blood work reviewed. Diet discussed compliance discussed maintain same dose  #3 reflux when necessary use of Prilosec discussed maintain  #4 pain thoracic likely subacute strain. Recommend no major workup at this time. Expect gradual resolution  Flu shot also today

## 2017-08-14 ENCOUNTER — Telehealth: Payer: Self-pay

## 2017-08-14 NOTE — Telephone Encounter (Signed)
Pt was on recall and left Vm that she is ready to schedule. I returned her call and left message for a return call, preferably this afternoon between 1:30-4:30.

## 2017-08-17 ENCOUNTER — Other Ambulatory Visit: Payer: Self-pay | Admitting: Family Medicine

## 2017-08-18 ENCOUNTER — Telehealth: Payer: Self-pay

## 2017-08-18 NOTE — Telephone Encounter (Signed)
See triage

## 2017-08-19 NOTE — Telephone Encounter (Signed)
Ok to schedule.

## 2017-08-19 NOTE — Telephone Encounter (Signed)
Gastroenterology Pre-Procedure Review  Request Date: 08/18/2017 Requesting Physician: ON RECALL   LAST 2008  PATIENT REVIEW QUESTIONS: The patient responded to the following health history questions as indicated:    1. Diabetes Melitis: no 2. Joint replacements in the past 12 months: no 3. Major health problems in the past 3 months: no 4. Has an artificial valve or MVP: no 5. Has a defibrillator: no 6. Has been advised in past to take antibiotics in advance of a procedure like teeth cleaning: no 7. Family history of colon cancer: no  8. Alcohol Use: a COUPLE OF OF GLASSES OF WINE ON SATURDAYS WITH DINNER 9. History of sleep apnea: no  10. History of coronary artery or other vascular stents placed within the last 12 months: no 11. History of any prior anesthesia complications: no    MEDICATIONS & ALLERGIES:    Patient reports the following regarding taking any blood thinners:   Plavix? no Aspirin? yes Coumadin? no Brilinta? no Xarelto? no Eliquis? no Pradaxa? no Savaysa? no Effient? no  Patient confirms/reports the following medications:  Current Outpatient Prescriptions  Medication Sig Dispense Refill  . aspirin 81 MG tablet Take 81 mg by mouth daily.    . Cholecalciferol 2000 units CAPS Take 1 daily 30 each   . lisinopril-hydrochlorothiazide (PRINZIDE,ZESTORETIC) 20-25 MG tablet Take 1 tablet by mouth daily. 30 tablet 5  . Multiple Vitamin (MULITIVITAMIN WITH MINERALS) TABS Take 1 tablet by mouth daily.    . Omega-3 Fatty Acids (FISH OIL TRIPLE STRENGTH PO) Take 1,500 mg by mouth daily.     . ranolazine (RANEXA) 500 MG 12 hr tablet Take 1 tablet (500 mg total) by mouth 2 (two) times daily. 180 tablet 3  . rosuvastatin (CRESTOR) 10 MG tablet Take 1 tablet (10 mg total) by mouth daily. for cholesterol 30 tablet 5  . omeprazole (PRILOSEC) 20 MG capsule Take 1 capsule (20 mg total) by mouth daily. (Patient taking differently: Take 20 mg by mouth daily. Only as needed) 30 capsule  5   No current facility-administered medications for this visit.     Patient confirms/reports the following allergies:  No Known Allergies  No orders of the defined types were placed in this encounter.   AUTHORIZATION INFORMATION Primary Insurance:   ID #:   Group #:  Pre-Cert / Auth required:  Pre-Cert / Auth #:   Secondary Insurance:  ID #:   Group #:  Pre-Cert / Auth required:  Pre-Cert / Auth #:   SCHEDULE INFORMATION: Procedure has been scheduled as follows:  Date:           Time:   Location:   This Gastroenterology Pre-Precedure Review Form is being routed to the following provider(s): Barney Drain, MD

## 2017-08-21 ENCOUNTER — Other Ambulatory Visit: Payer: Self-pay

## 2017-08-21 DIAGNOSIS — Z1211 Encounter for screening for malignant neoplasm of colon: Secondary | ICD-10-CM

## 2017-08-21 MED ORDER — NA SULFATE-K SULFATE-MG SULF 17.5-3.13-1.6 GM/177ML PO SOLN: 1.0000 | ORAL | 0 refills | Status: DC

## 2017-08-21 NOTE — Telephone Encounter (Signed)
Pt has been scheduled for 10/06/2017 at 8:30 Am with Dr. Oneida Alar. Rx sent to the pharmacy and instructions mailed to pt.

## 2017-10-06 ENCOUNTER — Other Ambulatory Visit: Payer: Self-pay

## 2017-10-06 ENCOUNTER — Encounter (HOSPITAL_COMMUNITY): Payer: Self-pay | Admitting: *Deleted

## 2017-10-06 ENCOUNTER — Ambulatory Visit (HOSPITAL_COMMUNITY)
Admission: RE | Admit: 2017-10-06 | Discharge: 2017-10-06 | Disposition: A | Discharge status: Home / Self Care | Payer: No Typology Code available for payment source | Source: Ambulatory Visit | Attending: Gastroenterology | Admitting: Gastroenterology

## 2017-10-06 ENCOUNTER — Encounter (HOSPITAL_COMMUNITY)
Admission: RE | Disposition: A | Discharge status: Home / Self Care | Payer: Self-pay | Source: Ambulatory Visit | Attending: Gastroenterology

## 2017-10-06 DIAGNOSIS — K5730 Diverticulosis of large intestine without perforation or abscess without bleeding: Secondary | ICD-10-CM | POA: Diagnosis not present

## 2017-10-06 DIAGNOSIS — D125 Benign neoplasm of sigmoid colon: Secondary | ICD-10-CM | POA: Diagnosis not present

## 2017-10-06 DIAGNOSIS — Z86010 Personal history of colonic polyps: Secondary | ICD-10-CM | POA: Insufficient documentation

## 2017-10-06 DIAGNOSIS — Z1211 Encounter for screening for malignant neoplasm of colon: Secondary | ICD-10-CM

## 2017-10-06 DIAGNOSIS — Z9049 Acquired absence of other specified parts of digestive tract: Secondary | ICD-10-CM | POA: Insufficient documentation

## 2017-10-06 DIAGNOSIS — K644 Residual hemorrhoidal skin tags: Secondary | ICD-10-CM | POA: Diagnosis not present

## 2017-10-06 DIAGNOSIS — Z1212 Encounter for screening for malignant neoplasm of rectum: Secondary | ICD-10-CM | POA: Diagnosis not present

## 2017-10-06 DIAGNOSIS — K635 Polyp of colon: Secondary | ICD-10-CM

## 2017-10-06 DIAGNOSIS — Z833 Family history of diabetes mellitus: Secondary | ICD-10-CM | POA: Insufficient documentation

## 2017-10-06 DIAGNOSIS — Z8482 Family history of sudden infant death syndrome: Secondary | ICD-10-CM | POA: Insufficient documentation

## 2017-10-06 DIAGNOSIS — G5603 Carpal tunnel syndrome, bilateral upper limbs: Secondary | ICD-10-CM | POA: Diagnosis not present

## 2017-10-06 DIAGNOSIS — Z981 Arthrodesis status: Secondary | ICD-10-CM | POA: Insufficient documentation

## 2017-10-06 DIAGNOSIS — E7800 Pure hypercholesterolemia, unspecified: Secondary | ICD-10-CM | POA: Insufficient documentation

## 2017-10-06 DIAGNOSIS — K648 Other hemorrhoids: Secondary | ICD-10-CM | POA: Insufficient documentation

## 2017-10-06 DIAGNOSIS — Z8249 Family history of ischemic heart disease and other diseases of the circulatory system: Secondary | ICD-10-CM | POA: Insufficient documentation

## 2017-10-06 DIAGNOSIS — Z87891 Personal history of nicotine dependence: Secondary | ICD-10-CM | POA: Diagnosis not present

## 2017-10-06 DIAGNOSIS — D126 Benign neoplasm of colon, unspecified: Secondary | ICD-10-CM | POA: Diagnosis not present

## 2017-10-06 DIAGNOSIS — Z90710 Acquired absence of both cervix and uterus: Secondary | ICD-10-CM | POA: Diagnosis not present

## 2017-10-06 DIAGNOSIS — Z79899 Other long term (current) drug therapy: Secondary | ICD-10-CM | POA: Diagnosis not present

## 2017-10-06 DIAGNOSIS — G8929 Other chronic pain: Secondary | ICD-10-CM | POA: Diagnosis not present

## 2017-10-06 DIAGNOSIS — I10 Essential (primary) hypertension: Secondary | ICD-10-CM | POA: Insufficient documentation

## 2017-10-06 DIAGNOSIS — Z800 Family history of malignant neoplasm of digestive organs: Secondary | ICD-10-CM | POA: Insufficient documentation

## 2017-10-06 DIAGNOSIS — D124 Benign neoplasm of descending colon: Secondary | ICD-10-CM | POA: Diagnosis not present

## 2017-10-06 DIAGNOSIS — Z803 Family history of malignant neoplasm of breast: Secondary | ICD-10-CM | POA: Insufficient documentation

## 2017-10-06 DIAGNOSIS — Z9889 Other specified postprocedural states: Secondary | ICD-10-CM | POA: Insufficient documentation

## 2017-10-06 DIAGNOSIS — Z7982 Long term (current) use of aspirin: Secondary | ICD-10-CM | POA: Insufficient documentation

## 2017-10-06 HISTORY — PX: COLONOSCOPY: SHX5424

## 2017-10-06 HISTORY — PX: POLYPECTOMY: SHX5525

## 2017-10-06 SURGERY — COLONOSCOPY
Anesthesia: Moderate Sedation

## 2017-10-06 MED ORDER — MEPERIDINE HCL 100 MG/ML IJ SOLN
INTRAMUSCULAR | Status: DC | PRN
  Administered 2017-10-06 (×2): 25 mg via INTRAVENOUS

## 2017-10-06 MED ORDER — SIMETHICONE 40 MG/0.6ML PO SUSP
ORAL | Status: DC | PRN
  Administered 2017-10-06: 09:00:00

## 2017-10-06 MED ORDER — MIDAZOLAM HCL 5 MG/5ML IJ SOLN
INTRAMUSCULAR | Status: DC | PRN
  Administered 2017-10-06 (×2): 2 mg via INTRAVENOUS

## 2017-10-06 MED ORDER — MIDAZOLAM HCL 5 MG/5ML IJ SOLN
INTRAMUSCULAR | Status: AC
  Filled 2017-10-06: qty 10

## 2017-10-06 MED ORDER — MEPERIDINE HCL 100 MG/ML IJ SOLN
INTRAMUSCULAR | Status: AC
  Filled 2017-10-06: qty 2

## 2017-10-06 MED ORDER — SODIUM CHLORIDE 0.9 % IV SOLN
INTRAVENOUS | Status: DC
  Administered 2017-10-06: 08:00:00 via INTRAVENOUS

## 2017-10-06 NOTE — Op Note (Signed)
[facility] Patient Name: Daisy Edwards Procedure Date: 10/06/2017 8:27 AM MRN: [id number] Date of Birth: 1956/07/19 Attending MD: Barney Drain MD, MD CSN: [id number] Age: 61 Admit Type: Ambulatory Procedure:                Colonoscopy with COLD SNARE POLYPECTOMY Indications:              High risk colon cancer surveillance: Personal                            history of colonic polyps Providers:                Barney Drain MD, MD, Otis Peak B. Sharon Seller, RN, Lurline Del, RN Referring MD:             Rosemary Holms, MD Medicines:                Meperidine 50 mg IV, Midazolam 4 mg IV Complications:            No immediate complications. Estimated Blood Loss:     Estimated blood loss was minimal. Procedure:                Pre-Anesthesia Assessment:                           - Prior to the procedure, a History and Physical                            was performed, and patient medications and                            allergies were reviewed. The patient's tolerance of                            previous anesthesia was also reviewed. The risks                            and benefits of the procedure and the sedation                            options and risks were discussed with the patient.                            All questions were answered, and informed consent                            was obtained. Prior Anticoagulants: The patient has                            taken no previous anticoagulant or antiplatelet                            agents. ASA Grade Assessment: II - A patient with  mild systemic disease. After reviewing the risks                            and benefits, the patient was deemed in                            satisfactory condition to undergo the procedure.                            After obtaining informed consent, the colonoscope                            was passed under direct vision. Throughout the                           procedure, the patient's blood pressure, pulse, and                            oxygen saturations were monitored continuously. The                            EC-[id number] ([id number]) scope was introduced through                            the anus and advanced to the the cecum, identified                            by appendiceal orifice and ileocecal valve. The                            colonoscopy was somewhat difficult due to a                            tortuous colon and the patient's cardiovascular                            instability (vasovagal reaction). VASOVAGAL                            RESPONSE(HR 43-45) WITH LOOPING IN THE COLON                            RESOLVED WHEN LOOP REDUCED. Successful completion                            of the procedure was aided by straightening and                            shortening the scope to obtain bowel loop reduction                            and COLOWRAP. The patient tolerated the procedure  well. The quality of the bowel preparation was                            excellent. The ileocecal valve, appendiceal                            orifice, and rectum were photographed. Scope In: 8:51:04 AM Scope Out: 9:12:15 AM Scope Withdrawal Time: 0 hours 18 minutes 10 seconds  Total Procedure Duration: 0 hours 21 minutes 11 seconds  Findings:      Five sessile polyps were found in the sigmoid colon and descending       colon. The polyps were 2 to 4 mm in size. These polyps were removed with       a cold snare. Resection and retrieval were complete.      A few small-mouthed diverticula were found in the recto-sigmoid colon,       sigmoid colon and descending colon.      External and internal hemorrhoids were found during retroflexion. The       hemorrhoids were mild. Impression:               - Five 2 to 4 mm polyps in the sigmoid colon and in                            the descending colon(4),  removed with a cold snare.                            Resected and retrieved.                           - Diverticulosis in the recto-sigmoid colon, in the                            sigmoid colon and in the descending colon.                           - External and internal hemorrhoids. Moderate Sedation:      Moderate (conscious) sedation was administered by the endoscopy nurse       and supervised by the endoscopist. The following parameters were       monitored: oxygen saturation, heart rate, blood pressure, and response       to care. Total physician intraservice time was 30 minutes. Recommendation:           - Repeat colonoscopy in 3 years for surveillance IF                            SIMPLE ADENOMAS(3 OR MORE) REMOVED.                           - High fiber diet.                           - Continue present medications.                           - Await pathology results.                           -  Patient has a contact number available for                            emergencies. The signs and symptoms of potential                            delayed complications were discussed with the                            patient. Return to normal activities tomorrow.                            Written discharge instructions were provided to the                            patient. Procedure Code(s):        --- Professional ---                           [phone], Colonoscopy, flexible; with removal of                            tumor(s), polyp(s), or other lesion(s) by snare                            technique                           99152, Moderate sedation services provided by the                            same physician or other qualified health care                            professional performing the diagnostic or                            therapeutic service that the sedation supports,                            requiring the presence of an independent trained                             observer to assist in the monitoring of the                            patient's level of consciousness and physiological                            status; initial 15 minutes of intraservice time,                            patient age 60 years or older  99153, Moderate sedation services; each additional                            15 minutes intraservice time Diagnosis Code(s):        --- Professional ---                           Z86.010, Personal history of colonic polyps                           D12.5, Benign neoplasm of sigmoid colon                           D12.4, Benign neoplasm of descending colon                           K64.8, Other hemorrhoids                           K57.30, Diverticulosis of large intestine without                            perforation or abscess without bleeding CPT copyright 2016 [employer]. All rights reserved. The codes documented in this report are preliminary and upon coder review may  be revised to meet current compliance requirements. Barney Drain, MD Barney Drain MD, MD 10/06/2017 9:24:01 AM This report has been signed electronically. Number of Addenda: 0

## 2017-10-06 NOTE — Discharge Instructions (Signed)
You have small internal hemorrhoids and diverticulosis IN YOUR LEFT COLON. YOU HAD FIVE SMALL POLYPS REMOVED.    DRINK WATER TO KEEP YOUR URINE LIGHT YELLOW.  CONTINUE YOUR WEIGHT LOSS EFFORTS. YOUR BODY MASS INDEX (BMI) IS OVER 30 WHICH MEANS YOU ARE OBESE. OBESITY CAN ACTIVATE CANCER GENES. OBESITY IS ASSOCIATED WITH AN INCREASED FOR CIRRHOSIS AND  ALL CANCERS, INCLUDING ESOPHAGEAL AND COLON CANCER. A WEIGHT OF 170    WILL GET YOUR BODY MASS INDEX(BMI) UNDER 30.   FOLLOW A HIGH FIBER DIET. AVOID ITEMS THAT CAUSE BLOATING. See info below.  YOUR BIOPSY RESULTS WILL BE AVAILABLE IN MY CHART AFTER NOV 22 AND MY OFFICE WILL CONTACT YOU IN 10-14 DAYS WITH YOUR RESULTS.   USE PREPARATION H FOUR TIMES  A DAY IF NEEDED TO RELIEVE RECTAL PAIN/PRESSURE/BLEEDING.  Next colonoscopy in 3 years.  Colonoscopy Care After Read the instructions outlined below and refer to this sheet in the next week. These discharge instructions provide you with general information on caring for yourself after you leave the hospital. While your treatment has been planned according to the most current medical practices available, unavoidable complications occasionally occur. If you have any problems or questions after discharge, call DR. Felecia Stanfill, [phone].  ACTIVITY  You may resume your regular activity, but move at a slower pace for the next 24 hours.   Take frequent rest periods for the next 24 hours.   Walking will help get rid of the air and reduce the bloated feeling in your belly (abdomen).   No driving for 24 hours (because of the medicine (anesthesia) used during the test).   You may shower.   Do not sign any important legal documents or operate any machinery for 24 hours (because of the anesthesia used during the test).    NUTRITION  Drink plenty of fluids.   You may resume your normal diet as instructed by your doctor.   Begin with a light meal and progress to your normal diet. Heavy or fried  foods are harder to digest and may make you feel sick to your stomach (nauseated).   Avoid alcoholic beverages for 24 hours or as instructed.    MEDICATIONS  You may resume your normal medications.   WHAT YOU CAN EXPECT TODAY  Some feelings of bloating in the abdomen.   Passage of more gas than usual.   Spotting of blood in your stool or on the toilet paper  .  IF YOU HAD POLYPS REMOVED DURING THE COLONOSCOPY:  Eat a soft diet IF YOU HAVE NAUSEA, BLOATING, ABDOMINAL PAIN, OR VOMITING.    FINDING OUT THE RESULTS OF YOUR TEST Not all test results are available during your visit. DR. Oneida Alar WILL CALL YOU WITHIN 14 DAYS OF YOUR PROCEDUE WITH YOUR RESULTS. Do not assume everything is normal if you have not heard from DR. Sandrine Bloodsworth, CALL HER OFFICE AT [phone].  SEEK IMMEDIATE MEDICAL ATTENTION AND CALL THE OFFICE: [phone] IF:  You have more than a spotting of blood in your stool.   Your belly is swollen (abdominal distention).   You are nauseated or vomiting.   You have a temperature over 101F.   You have abdominal pain or discomfort that is severe or gets worse throughout the day.  High-Fiber Diet A high-fiber diet changes your normal diet to include more whole grains, legumes, fruits, and vegetables. Changes in the diet involve replacing refined carbohydrates with unrefined foods. The calorie level of the diet is essentially unchanged. The Dietary  Reference Intake (recommended amount) for adult males is 38 grams per day. For adult females, it is 25 grams per day. Pregnant and lactating women should consume 28 grams of fiber per day. Fiber is the intact part of a plant that is not broken down during digestion. Functional fiber is fiber that has been isolated from the plant to provide a beneficial effect in the body. PURPOSE  Increase stool bulk.   Ease and regulate bowel movements.   Lower cholesterol.   REDUCE RISK OF COLON CANCER  INDICATIONS THAT YOU NEED MORE  FIBER  Constipation and hemorrhoids.   Uncomplicated diverticulosis (intestine condition) and irritable bowel syndrome.   Weight management.   As a protective measure against hardening of the arteries (atherosclerosis), diabetes, and cancer.   GUIDELINES FOR INCREASING FIBER IN THE DIET  Start adding fiber to the diet slowly. A gradual increase of about 5 more grams (2 slices of whole-wheat bread, 2 servings of most fruits or vegetables, or 1 bowl of high-fiber cereal) per day is best. Too rapid an increase in fiber may result in constipation, flatulence, and bloating.   Drink enough water and fluids to keep your urine clear or pale yellow. Water, juice, or caffeine-free drinks are recommended. Not drinking enough fluid may cause constipation.   Eat a variety of high-fiber foods rather than one type of fiber.   Try to increase your intake of fiber through using high-fiber foods rather than fiber pills or supplements that contain small amounts of fiber.   The goal is to change the types of food eaten. Do not supplement your present diet with high-fiber foods, but replace foods in your present diet.   INCLUDE A VARIETY OF FIBER SOURCES  Replace refined and processed grains with whole grains, canned fruits with fresh fruits, and incorporate other fiber sources. White rice, white breads, and most bakery goods contain little or no fiber.   Shutt whole-grain rice, buckwheat oats, and many fruits and vegetables are all good sources of fiber. These include: broccoli, Brussels sprouts, cabbage, cauliflower, beets, sweet potatoes, white potatoes (skin on), carrots, tomatoes, eggplant, squash, berries, fresh fruits, and dried fruits.   Cereals appear to be the richest source of fiber. Cereal fiber is found in whole grains and bran. Bran is the fiber-rich outer coat of cereal grain, which is largely removed in refining. In whole-grain cereals, the bran remains. In breakfast cereals, the largest  amount of fiber is found in those with "bran" in their names. The fiber content is sometimes indicated on the label.   You may need to include additional fruits and vegetables each day.   In baking, for 1 cup white flour, you may use the following substitutions:   1 cup whole-wheat flour minus 2 tablespoons.   1/2 cup white flour plus 1/2 cup whole-wheat flour.   Polyps, Colon  A polyp is extra tissue that grows inside your body. Colon polyps grow in the large intestine. The large intestine, also called the colon, is part of your digestive system. It is a long, hollow tube at the end of your digestive tract where your body makes and stores stool. Most polyps are not dangerous. They are benign. This means they are not cancerous. But over time, some types of polyps can turn into cancer. Polyps that are smaller than a pea are usually not harmful. But larger polyps could someday become or may already be cancerous. To be safe, doctors remove all polyps and test them.   PREVENTION  There is not one sure way to prevent polyps. You might be able to lower your risk of getting them if you:  Eat more fruits and vegetables and less fatty food.   Do not smoke.   Avoid alcohol.   Exercise every day.   Lose weight if you are overweight.   Eating more calcium and folate can also lower your risk of getting polyps. Some foods that are rich in calcium are milk, cheese, and broccoli. Some foods that are rich in folate are chickpeas, kidney beans, and spinach.    Diverticulosis Diverticulosis is a common condition that develops when small pouches (diverticula) form in the wall of the colon. The risk of diverticulosis increases with age. It happens more often in people who eat a low-fiber diet. Most individuals with diverticulosis have no symptoms. Those individuals with symptoms usually experience belly (abdominal) pain, constipation, or loose stools (diarrhea).  HOME CARE INSTRUCTIONS  Increase the  amount of fiber in your diet as directed by your caregiver or dietician. This may reduce symptoms of diverticulosis.   Drink at least 6 to 8 glasses of water each day to prevent constipation.   Try not to strain when you have a bowel movement.   Avoiding nuts and seeds to prevent complications is NOT NECESSARY.   FOODS HAVING HIGH FIBER CONTENT INCLUDE:  Fruits. Apple, peach, pear, tangerine, raisins, prunes.   Vegetables. Brussels sprouts, asparagus, broccoli, cabbage, carrot, cauliflower, romaine lettuce, spinach, summer squash, tomato, winter squash, zucchini.   Starchy Vegetables. Baked beans, kidney beans, lima beans, split peas, lentils, potatoes (with skin).   Grains. Whole wheat bread, Tirey rice, bran flake cereal, plain oatmeal, white rice, shredded wheat, bran muffins.   SEEK IMMEDIATE MEDICAL CARE IF:  You develop increasing pain or severe bloating.   You have an oral temperature above 101F.   You develop vomiting or bowel movements that are bloody or black.   Hemorrhoids Hemorrhoids are dilated (enlarged) veins around the rectum. Sometimes clots will form in the veins. This makes them swollen and painful. These are called thrombosed hemorrhoids. Causes of hemorrhoids include:  Constipation.   Straining to have a bowel movement.   HEAVY LIFTING   HOME CARE INSTRUCTIONS  Eat a well balanced diet and drink 6 to 8 glasses of water every day to avoid constipation. You may also use a bulk laxative.   Avoid straining to have bowel movements.   Keep anal area dry and clean.   Do not use a donut shaped pillow or sit on the toilet for long periods. This increases blood pooling and pain.   Move your bowels when your body has the urge; this will require less straining and will decrease pain and pressure.

## 2017-10-06 NOTE — H&P (Signed)
Primary Care Physician:  Mikey Kirschner, MD Primary Gastroenterologist:  Dr. Oneida Alar  Pre-Procedure History & Physical: HPI:  Daisy Edwards is a 61 y.o. female here for [place].  Past Medical History:  Diagnosis Date  . Anemia   . Anxiety    Panic attack in past  . Arthritis    Cervical stenosis, spondylosis   . Back pain, chronic   . Carpal tunnel syndrome on both sides    Right > left  . Cervical vertebral fusion   . Chronic leg pain   . Elevated cholesterol   . Essential hypertension   . Hyperlipidemia   . Impaired fasting glucose     Past Surgical History:  Procedure Laterality Date  . ABDOMINAL HYSTERECTOMY    . ANTERIOR CERVICAL DECOMPRESSION/DISCECTOMY FUSION 2 LEVELS N/A 01/29/2012   Performed by Marybelle Killings, MD at [facility]  . CARDIAC CATHETERIZATION     prior to 2008, not sure where it was done, result- wnl   . CHOLECYSTECTOMY     [facility]  . COLONOSCOPY    . VEIN SURGERY      Prior to Admission medications   Medication Sig Start Date End Date Taking? Authorizing Provider  aspirin 81 MG tablet Take 81 mg by mouth daily.   Yes [provider]  lisinopril-hydrochlorothiazide (PRINZIDE,ZESTORETIC) 20-25 MG tablet Take 1 tablet by mouth daily. 08/01/17  Yes Mikey Kirschner, MD  Multiple Vitamin (MULITIVITAMIN WITH MINERALS) TABS Take 1 tablet by mouth daily.   Yes [provider]  Omega-3 Fatty Acids (FISH OIL TRIPLE STRENGTH PO) Take 1,500 mg by mouth daily.    Yes [provider]  ranolazine (RANEXA) 500 MG 12 hr tablet Take 1 tablet (500 mg total) by mouth 2 (two) times daily. 03/10/17  Yes Satira Sark, MD  rosuvastatin (CRESTOR) 10 MG tablet Take 1 tablet (10 mg total) by mouth daily. for cholesterol 08/01/17  Yes Mikey Kirschner, MD  Cholecalciferol 2000 units CAPS Take 1 daily Patient taking differently: Take 1 capsule daily by mouth. Take 1 daily 05/13/16   Derrek Monaco A, NP  omeprazole (PRILOSEC)  20 MG capsule Take 1 capsule (20 mg total) by mouth daily. Patient taking differently: Take 20 mg daily as needed by mouth. Only as needed 08/01/17   Mikey Kirschner, MD    Allergies as of 08/21/2017  . (No Known Allergies)    Family History  Problem Relation Age of Onset  . Heart disease Father        MI  . Heart attack Father   . Hypertension Mother   . Aneurysm Mother        brain  . Cancer Maternal Aunt        breast  . Diabetes Maternal Aunt   . Cancer Maternal Grandmother        colon   . Diabetes Maternal Aunt   . SIDS Son   . Anesthesia problems Neg Hx     Social History   Socioeconomic History  . Marital status: Married    Spouse name: Not on file  . Number of children: 0  . Years of education: Not on file  . Highest education level: Not on file  Social Needs  . Financial resource strain: Not on file  . Food insecurity - worry: Not on file  . Food insecurity - inability: Not on file  . Transportation needs - medical: Not on file  . Transportation needs -  non-medical: Not on file  Occupational History    Employer: [employer]  Tobacco Use  . Smoking status: Former Smoker    Years: 0.50    Types: Cigarettes  . Smokeless tobacco: Never Used  . Tobacco comment: quit 30 years ago.   Substance and Sexual Activity  . Alcohol use: Yes    Alcohol/week: 2.4 oz    Types: 4 Glasses of wine per week    Comment: wine, socially   . Drug use: No  . Sexual activity: Yes    Birth control/protection: Surgical    Comment: hyst  Other Topics Concern  . Not on file  Social History Narrative  . Not on file    Review of Systems: See HPI, otherwise negative ROS   Physical Exam: BP 122/68   Pulse 73   Temp 98.4 F (36.9 C) (Oral)   Resp 18  General:   Alert,  pleasant and cooperative in NAD Head:  Normocephalic and atraumatic. Neck:  Supple; Lungs:  Clear throughout to auscultation.    Heart:  Regular rate and rhythm. Abdomen:  Soft, nontender and  nondistended. Normal bowel sounds, without guarding, and without rebound.   Neurologic:  Alert and  oriented x4;  grossly normal neurologically.  Impression/Plan:     SCREENING  Plan:  1. TCS TODAY DISCUSSED PROCEDURE, BENEFITS, & RISKS: < 1% chance of medication reaction, bleeding, perforation, or rupture of spleen/liver.

## 2017-10-07 NOTE — Progress Notes (Signed)
LMOM to call.

## 2017-10-08 ENCOUNTER — Encounter (HOSPITAL_COMMUNITY): Payer: Self-pay | Admitting: Gastroenterology

## 2017-10-08 NOTE — Progress Notes (Signed)
Pt is aware.  

## 2017-10-08 NOTE — Progress Notes (Signed)
LM for pt to call

## 2017-12-15 ENCOUNTER — Other Ambulatory Visit: Payer: Self-pay | Admitting: *Deleted

## 2017-12-15 MED ORDER — ROSUVASTATIN CALCIUM 10 MG PO TABS: 10.0000 mg | ORAL_TABLET | Freq: Every day | ORAL | 0 refills | Status: DC

## 2018-01-29 ENCOUNTER — Telehealth: Payer: Self-pay | Admitting: Family Medicine

## 2018-01-29 NOTE — Telephone Encounter (Signed)
Patient has an appointment 02/06/18 with Hoyle Sauer.  She is requesting orders for labs.

## 2018-01-30 ENCOUNTER — Other Ambulatory Visit: Payer: Self-pay | Admitting: Nurse Practitioner

## 2018-01-30 DIAGNOSIS — E785 Hyperlipidemia, unspecified: Secondary | ICD-10-CM

## 2018-01-30 NOTE — Telephone Encounter (Signed)
Labs put in and pt notified

## 2018-01-30 NOTE — Telephone Encounter (Signed)
She just needs her lipid and liver profiles. Thanks.

## 2018-02-06 ENCOUNTER — Ambulatory Visit: Payer: 59 | Admitting: Nurse Practitioner

## 2018-02-07 LAB — LIPID PANEL
Chol/HDL Ratio: 2.6 ratio (ref 0.0–4.4)
Cholesterol, Total: 180 mg/dL (ref 100–199)
HDL: 68 mg/dL (ref >39)
LDL Calculated: 98 mg/dL (ref 0–99)
Triglycerides: 68 mg/dL (ref 0–149)
VLDL Cholesterol Cal: 14 mg/dL (ref 5–40)

## 2018-02-07 LAB — HEPATIC FUNCTION PANEL
ALT: 25 IU/L (ref 0–32)
AST: 19 IU/L (ref 0–40)
Albumin: 4.9 g/dL — ABNORMAL HIGH (ref 3.6–4.8)
Alkaline Phosphatase: 51 IU/L (ref 39–117)
Bilirubin Total: 0.5 mg/dL (ref 0.0–1.2)
Bilirubin, Direct: 0.16 mg/dL (ref 0.00–0.40)
Total Protein: 7.1 g/dL (ref 6.0–8.5)

## 2018-02-13 ENCOUNTER — Telehealth: Payer: Self-pay | Admitting: Family Medicine

## 2018-02-13 ENCOUNTER — Telehealth: Payer: Self-pay | Admitting: Nurse Practitioner

## 2018-02-13 ENCOUNTER — Encounter: Payer: Self-pay | Admitting: Nurse Practitioner

## 2018-02-13 ENCOUNTER — Ambulatory Visit: Payer: BLUE CROSS/BLUE SHIELD | Admitting: Nurse Practitioner

## 2018-02-13 VITALS — BP 142/86 | Ht 64.0 in | Wt 182.0 lb

## 2018-02-13 DIAGNOSIS — I10 Essential (primary) hypertension: Secondary | ICD-10-CM

## 2018-02-13 DIAGNOSIS — E785 Hyperlipidemia, unspecified: Secondary | ICD-10-CM | POA: Diagnosis not present

## 2018-02-13 DIAGNOSIS — Z13820 Encounter for screening for osteoporosis: Secondary | ICD-10-CM | POA: Diagnosis not present

## 2018-02-13 DIAGNOSIS — G5793 Unspecified mononeuropathy of bilateral lower limbs: Secondary | ICD-10-CM | POA: Diagnosis not present

## 2018-02-13 NOTE — Telephone Encounter (Signed)
Patient is aware 

## 2018-02-13 NOTE — Telephone Encounter (Signed)
Changed to screening which should be under preventive care

## 2018-02-13 NOTE — Patient Instructions (Signed)
BP goal 140/90; check BP outside office and call back if remains elevated

## 2018-02-13 NOTE — Telephone Encounter (Signed)
Pt seen earlier; Daisy Edwards ordered DEXA scan, called over to radiology and all line were busy. Nurse called back a short while later over to radiology and set up appt.  APPT  April 5,2019 at 3:30 be there at 3:15 for registration. No calcium supplements 24-48 hours; comfy 2 piece clothing, and complete list of med.

## 2018-02-13 NOTE — Telephone Encounter (Signed)
Left message on home answering machine to return call

## 2018-02-13 NOTE — Telephone Encounter (Signed)
Patient said that we scheduled her for a bone density test at [facility]. She wants to know if this was scheduled under preventative so that her insurance will cover it.

## 2018-02-14 ENCOUNTER — Encounter: Payer: Self-pay | Admitting: Nurse Practitioner

## 2018-02-14 DIAGNOSIS — R7303 Prediabetes: Secondary | ICD-10-CM | POA: Insufficient documentation

## 2018-02-14 LAB — HEMOGLOBIN A1C
Est. average glucose Bld gHb Est-mCnc: 128 mg/dL
Hgb A1c MFr Bld: 6.1 % — ABNORMAL HIGH (ref 4.8–5.6)

## 2018-02-14 LAB — FERRITIN: Ferritin: 47 ng/mL (ref 15–150)

## 2018-02-14 LAB — VITAMIN B12: Vitamin B-12: 678 pg/mL (ref 232–1245)

## 2018-02-14 NOTE — Progress Notes (Signed)
Subjective:  Presents for recheck on her BP and cholesterol. Compliant with medications. Minimal exercise. Overall healthy diet. Gets regular female exams with her GYN. Denies CP/ischemic type pain or SOB. No visual changes. No difficulty speaking or swallowing. No numbness or weakness of the face, arms or legs. Also c/o slight numbness and tingling in both great toes at times. No change in shoes. Began about 6 months ago. Slight worse.    Objective:   BP (!) 142/86   Ht 5\' 4"  (1.626 m)   Wt 182 lb (82.6 kg)   BMI 31.24 kg/m  NAD. Alert, oriented. Lungs clear. Heart RRR. Carotids: no bruits or thrills. BP on recheck 152/86.  LE: no edema.  Diabetic Foot Exam - Simple   Simple Foot Form Diabetic Foot exam was performed with the following findings:  Yes 02/13/2018 10:30 AM  Visual Inspection No deformities, no ulcerations, no other skin breakdown bilaterally:  Yes Sensation Testing Intact to touch and monofilament testing bilaterally:  Yes Pulse Check See comments:  Yes Comments DP pulses present bilat; skin warm with normal cap refill     Assessment:   Problem List Items Addressed This Visit      Cardiovascular and Mediastinum   Essential hypertension, benign - Primary     Other   Hyperlipidemia    Other Visit Diagnoses    Neuropathy of both feet       Relevant Orders   HgB A1c (Completed)   B12 (Completed)   Ferritin (Completed)   Screening for osteoporosis       Relevant Orders   DG Bone Density     See labs 02/06/18. Reviewed with patient.   Plan:   Continue current medications. Increase activity. Labs pending.  BP goal 140/90; check BP outside office and call back if remains elevated Return in about 6 months (around 08/16/2018) for recheck. 25 minutes was spent with the patient.  This statement verifies that 25 minutes was indeed spent with the patient. Greater than half the time was spent in discussion, counseling and answering questions  regarding the issues that  the patient came in for today as reflected in the diagnosis (s) please refer to documentation for further details.

## 2018-02-16 ENCOUNTER — Other Ambulatory Visit: Payer: Self-pay | Admitting: *Deleted

## 2018-02-16 DIAGNOSIS — M8580 Other specified disorders of bone density and structure, unspecified site: Secondary | ICD-10-CM

## 2018-02-16 DIAGNOSIS — Z780 Asymptomatic menopausal state: Secondary | ICD-10-CM

## 2018-02-16 DIAGNOSIS — Z0000 Encounter for general adult medical examination without abnormal findings: Secondary | ICD-10-CM

## 2018-02-16 NOTE — Telephone Encounter (Signed)
Also talked with Bethena Roys at [facility] and she was going to cancel the other test and put the appointment with the test just added with new diagnosis code. Left message with pt to let her know.

## 2018-02-16 NOTE — Telephone Encounter (Signed)
New order put in with screening diagnosis. Left message to return call to notify pt.

## 2018-02-17 NOTE — Telephone Encounter (Signed)
Pt.notified

## 2018-02-23 ENCOUNTER — Other Ambulatory Visit (HOSPITAL_COMMUNITY): Payer: Self-pay

## 2018-02-23 ENCOUNTER — Ambulatory Visit (HOSPITAL_COMMUNITY)
Admission: RE | Admit: 2018-02-23 | Discharge: 2018-02-23 | Disposition: A | Discharge status: Home / Self Care | Payer: Self-pay | Source: Ambulatory Visit | Attending: Nurse Practitioner | Admitting: Nurse Practitioner

## 2018-02-23 DIAGNOSIS — Z13820 Encounter for screening for osteoporosis: Secondary | ICD-10-CM | POA: Insufficient documentation

## 2018-02-23 DIAGNOSIS — Z0000 Encounter for general adult medical examination without abnormal findings: Secondary | ICD-10-CM

## 2018-02-23 DIAGNOSIS — M8580 Other specified disorders of bone density and structure, unspecified site: Secondary | ICD-10-CM

## 2018-02-23 DIAGNOSIS — Z780 Asymptomatic menopausal state: Secondary | ICD-10-CM | POA: Insufficient documentation

## 2018-03-03 ENCOUNTER — Other Ambulatory Visit: Payer: Self-pay | Admitting: Cardiology

## 2018-03-14 ENCOUNTER — Other Ambulatory Visit: Payer: Self-pay | Admitting: Family Medicine

## 2018-06-09 ENCOUNTER — Ambulatory Visit: Payer: BLUE CROSS/BLUE SHIELD | Admitting: Family Medicine

## 2018-06-16 ENCOUNTER — Ambulatory Visit: Payer: BLUE CROSS/BLUE SHIELD | Admitting: Nurse Practitioner

## 2018-06-16 ENCOUNTER — Encounter: Payer: Self-pay | Admitting: Nurse Practitioner

## 2018-06-16 VITALS — BP 148/72 | Temp 98.5°F | Ht 64.0 in | Wt 177.0 lb

## 2018-06-16 DIAGNOSIS — J069 Acute upper respiratory infection, unspecified: Secondary | ICD-10-CM | POA: Diagnosis not present

## 2018-06-16 DIAGNOSIS — B9689 Other specified bacterial agents as the cause of diseases classified elsewhere: Secondary | ICD-10-CM | POA: Diagnosis not present

## 2018-06-16 DIAGNOSIS — K219 Gastro-esophageal reflux disease without esophagitis: Secondary | ICD-10-CM

## 2018-06-16 MED ORDER — OMEPRAZOLE 20 MG PO CPDR: 20.0000 mg | DELAYED_RELEASE_CAPSULE | Freq: Every day | ORAL | 2 refills | Status: DC | PRN

## 2018-06-16 MED ORDER — AZITHROMYCIN 250 MG PO TABS: ORAL_TABLET | ORAL | 0 refills | Status: DC

## 2018-06-16 NOTE — Patient Instructions (Signed)
Food Choices for Gastroesophageal Reflux Disease, Adult When you have gastroesophageal reflux disease (GERD), the foods you eat and your eating habits are very important. Choosing the right foods can help ease your discomfort. What guidelines do I need to follow?  Choose fruits, vegetables, whole grains, and low-fat dairy products.  Choose low-fat meat, fish, and poultry.  Limit fats such as oils, salad dressings, butter, nuts, and avocado.  Keep a food diary. This helps you identify foods that cause symptoms.  Avoid foods that cause symptoms. These may be different for everyone.  Eat small meals often instead of 3 large meals a day.  Eat your meals slowly, in a place where you are relaxed.  Limit fried foods.  Cook foods using methods other than frying.  Avoid drinking alcohol.  Avoid drinking large amounts of liquids with your meals.  Avoid bending over or lying down until 2-3 hours after eating. What foods are not recommended? These are some foods and drinks that may make your symptoms worse: Vegetables  Tomatoes. Tomato juice. Tomato and spaghetti sauce. Chili peppers. Onion and garlic. Horseradish. Fruits  Oranges, grapefruit, and lemon (fruit and juice). Meats  High-fat meats, fish, and poultry. This includes hot dogs, ribs, ham, sausage, salami, and bacon. Dairy  Whole milk and chocolate milk. Sour cream. Cream. Butter. Ice cream. Cream cheese. Drinks  Coffee and tea. Bubbly (carbonated) drinks or energy drinks. Condiments  Hot sauce. Barbecue sauce. Sweets/Desserts  Chocolate and cocoa. Donuts. Peppermint and spearmint. Fats and Oils  High-fat foods. This includes French fries and potato chips. Other  Vinegar. Strong spices. This includes black pepper, white pepper, red pepper, cayenne, curry powder, cloves, ginger, and chili powder. The items listed above may not be a complete list of foods and drinks to avoid. Contact your dietitian for more information.    This information is not intended to replace advice given to you by your health care provider. Make sure you discuss any questions you have with your health care provider. Document Released: 05/05/2012 Document Revised: 04/11/2016 Document Reviewed: 09/08/2013 Elsevier Interactive Patient Education  2017 Elsevier Inc.  

## 2018-06-17 ENCOUNTER — Encounter: Payer: Self-pay | Admitting: Nurse Practitioner

## 2018-06-17 ENCOUNTER — Other Ambulatory Visit: Payer: Self-pay | Admitting: Family Medicine

## 2018-06-17 NOTE — Progress Notes (Signed)
Subjective: Presents for complaints of cold symptoms off and on for the past month.  No fever.  Slight sore throat at times, tends to lose her voice mainly in the evenings.  Maxillary area sinus headache.  Ear pressure.  Cough is improved, mainly in the evenings and at nighttime producing light green sputum at times.  No difficulty swallowing.  No wheezing.  Some reflux symptoms at times, has not been taking her omeprazole on a regular basis.  Some caffeine intake.  Drinks 1 glass of wine per day.  Non-smoker.  Denies any excessive NSAID use.  No vomiting diarrhea or abdominal pain.  Objective:   BP (!) 148/72   Temp 98.5 F (36.9 C) (Oral)   Ht 5\' 4"  (1.626 m)   Wt 177 lb 0.6 oz (80.3 kg)   BMI 30.39 kg/m  NAD.  Alert, oriented.  TMs retracted, no erythema.  Pharynx injected with PND noted.  Neck supple with mild soft anterior adenopathy.  Lungs clear.  Heart regular rate rhythm.  Abdomen soft nondistended with mild epigastric area tenderness.  No rebound or guarding.  No obvious masses.  Assessment:  Bacterial upper respiratory infection  Gastroesophageal reflux disease without esophagitis    Plan:   Meds ordered this encounter  Medications  . omeprazole (PRILOSEC) 20 MG capsule    Sig: Take 1 capsule (20 mg total) by mouth daily as needed. Only as needed    Dispense:  30 capsule    Refill:  2    Order Specific Question:   Supervising Provider    Answer:   Mikey Kirschner [[id number]]  . azithromycin (ZITHROMAX Z-PAK) 250 MG tablet    Sig: Take 2 tablets (500 mg) on  Day 1,  followed by 1 tablet (250 mg) once daily on Days 2 through 5.    Dispense:  6 each    Refill:  0    Order Specific Question:   Supervising Provider    Answer:   Mikey Kirschner [[id number]]   OTC meds as directed for congestion and cough.  Start Z-Pak as directed.  Restart omeprazole as directed daily for at least 2 weeks.  Call back at that time if reflux symptoms persist.  Explained that sinus drainage and reflux  together are probably causing her hoarseness.  Warning signs reviewed.  Call back if worsens or persists.

## 2018-07-24 ENCOUNTER — Other Ambulatory Visit: Payer: BLUE CROSS/BLUE SHIELD | Admitting: Adult Health

## 2018-09-07 ENCOUNTER — Other Ambulatory Visit: Payer: Self-pay | Admitting: *Deleted

## 2018-09-07 MED ORDER — OMEPRAZOLE 20 MG PO CPDR
20.0000 mg | DELAYED_RELEASE_CAPSULE | Freq: Every day | ORAL | 2 refills | Status: DC | PRN
Start: 2018-09-07 — End: 2019-05-04

## 2018-09-15 ENCOUNTER — Other Ambulatory Visit: Payer: Self-pay | Admitting: Family Medicine

## 2018-10-12 ENCOUNTER — Other Ambulatory Visit: Payer: Self-pay | Admitting: Family Medicine

## 2018-10-19 ENCOUNTER — Encounter: Payer: Self-pay | Admitting: Adult Health

## 2018-10-19 ENCOUNTER — Other Ambulatory Visit: Payer: Self-pay | Admitting: Adult Health

## 2018-10-19 ENCOUNTER — Ambulatory Visit (INDEPENDENT_AMBULATORY_CARE_PROVIDER_SITE_OTHER): Payer: BLUE CROSS/BLUE SHIELD | Admitting: Adult Health

## 2018-10-19 VITALS — BP 114/66 | HR 72 | Ht 63.2 in | Wt 178.6 lb

## 2018-10-19 DIAGNOSIS — Z1212 Encounter for screening for malignant neoplasm of rectum: Secondary | ICD-10-CM

## 2018-10-19 DIAGNOSIS — Z01419 Encounter for gynecological examination (general) (routine) without abnormal findings: Secondary | ICD-10-CM | POA: Diagnosis not present

## 2018-10-19 DIAGNOSIS — Z1211 Encounter for screening for malignant neoplasm of colon: Secondary | ICD-10-CM | POA: Diagnosis not present

## 2018-10-19 DIAGNOSIS — Z1231 Encounter for screening mammogram for malignant neoplasm of breast: Secondary | ICD-10-CM

## 2018-10-19 LAB — HEMOCCULT GUIAC POC 1CARD (OFFICE): Fecal Occult Blood, POC: NEGATIVE

## 2018-10-19 NOTE — Progress Notes (Signed)
Patient ID: Daisy Edwards, female   DOB: [birth year], 62 y.o.   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  PCP is Dr Wolfgang Phoenix.   Current Medications, Allergies, Past Medical History, Past Surgical History, Family History and Social History were reviewed in [employer] record.     Review of Systems: Patient denies any headaches, hearing loss, fatigue, blurred vision, shortness of breath, chest pain, abdominal pain, problems with bowel movements, urination, or intercourse. No joint pain or mood swings. Right breast tender at times    Physical Exam:BP 114/66 (BP Location: Right Arm, Patient Position: Sitting, Cuff Size: Normal)   Pulse 72   Ht 5' 3.2" (1.605 m)   Wt 178 lb 9.6 oz (81 kg)   BMI 31.44 kg/m  General:  Well developed, well nourished, no acute distress Skin:  Warm and dry Neck:  Midline trachea, normal thyroid, good ROM, no lymphadenopathy,no carotid bruits heard Lungs; Clear to auscultation bilaterally Breast:  No dominant palpable mass, retraction, or nipple discharge Cardiovascular: Regular rate and rhythm Abdomen:  Soft, non tender, no hepatosplenomegaly Pelvic:  External genitalia is normal in appearance, no lesions.  The vagina is normal in appearance. Urethra has no lesions or masses. The cervix and uterus are absent.  No adnexal masses or tenderness noted.Bladder is non tender, no masses felt. Rectal: Good sphincter tone, no polyps, or hemorrhoids felt.  Hemoccult negative.+rectocele Extremities/musculoskeletal:  No swelling or varicosities noted, no clubbing or cyanosis Psych:  No mood changes, alert and cooperative,seems happy PHQ 9 score 6, denies being depressed just some days a little difficult, no SI.  Fall risk low. Examination chaperoned by Diona Fanti CMA.  Impression: 1. Encounter for well woman exam with  routine gynecological exam   2. Screening for colorectal cancer       Plan: Mammogram scheduled for her 12/9 at 6:15  Physical in 1 year Labs with PCP  Colonoscopy per GI

## 2018-10-26 ENCOUNTER — Encounter (HOSPITAL_COMMUNITY): Payer: Self-pay

## 2018-10-26 ENCOUNTER — Ambulatory Visit (HOSPITAL_COMMUNITY)
Admission: RE | Admit: 2018-10-26 | Discharge: 2018-10-26 | Disposition: A | Discharge status: Home / Self Care | Payer: BLUE CROSS/BLUE SHIELD | Source: Ambulatory Visit | Attending: Adult Health | Admitting: Adult Health

## 2018-10-26 DIAGNOSIS — Z1231 Encounter for screening mammogram for malignant neoplasm of breast: Secondary | ICD-10-CM | POA: Diagnosis not present

## 2018-10-27 ENCOUNTER — Other Ambulatory Visit: Payer: Self-pay | Admitting: Adult Health

## 2018-10-27 DIAGNOSIS — R928 Other abnormal and inconclusive findings on diagnostic imaging of breast: Secondary | ICD-10-CM

## 2018-10-29 ENCOUNTER — Other Ambulatory Visit: Payer: Self-pay | Admitting: Cardiology

## 2018-11-03 ENCOUNTER — Encounter (HOSPITAL_COMMUNITY): Payer: Self-pay

## 2018-11-03 ENCOUNTER — Ambulatory Visit (HOSPITAL_COMMUNITY)
Admission: RE | Admit: 2018-11-03 | Discharge: 2018-11-03 | Disposition: A | Discharge status: Home / Self Care | Payer: BLUE CROSS/BLUE SHIELD | Source: Ambulatory Visit | Attending: Adult Health | Admitting: Adult Health

## 2018-11-03 DIAGNOSIS — R928 Other abnormal and inconclusive findings on diagnostic imaging of breast: Secondary | ICD-10-CM | POA: Diagnosis not present

## 2018-11-03 DIAGNOSIS — N6489 Other specified disorders of breast: Secondary | ICD-10-CM | POA: Diagnosis not present

## 2018-11-03 DIAGNOSIS — N6041 Mammary duct ectasia of right breast: Secondary | ICD-10-CM | POA: Insufficient documentation

## 2018-12-10 ENCOUNTER — Other Ambulatory Visit: Payer: Self-pay | Admitting: Family Medicine

## 2018-12-11 ENCOUNTER — Other Ambulatory Visit: Payer: Self-pay | Admitting: Family Medicine

## 2019-01-14 ENCOUNTER — Telehealth: Payer: Self-pay | Admitting: Family Medicine

## 2019-01-14 DIAGNOSIS — Z79899 Other long term (current) drug therapy: Secondary | ICD-10-CM

## 2019-01-14 DIAGNOSIS — I10 Essential (primary) hypertension: Secondary | ICD-10-CM

## 2019-01-14 DIAGNOSIS — R7303 Prediabetes: Secondary | ICD-10-CM

## 2019-01-14 DIAGNOSIS — E785 Hyperlipidemia, unspecified: Secondary | ICD-10-CM

## 2019-01-14 NOTE — Telephone Encounter (Signed)
Pt has appt 01/18/2019, would like labs ordered   Please advise & call pt

## 2019-01-14 NOTE — Telephone Encounter (Signed)
Last labs march 2019. Lipid, liver, ferritin, b12, a1c

## 2019-01-14 NOTE — Telephone Encounter (Signed)
Orders put in. Left message for pt to return call to notify her orders are put in.

## 2019-01-14 NOTE — Telephone Encounter (Signed)
Lip liv m7 a1c cbc

## 2019-01-15 NOTE — Telephone Encounter (Signed)
Pt aware labs are in

## 2019-01-16 DIAGNOSIS — E785 Hyperlipidemia, unspecified: Secondary | ICD-10-CM | POA: Diagnosis not present

## 2019-01-16 DIAGNOSIS — I10 Essential (primary) hypertension: Secondary | ICD-10-CM | POA: Diagnosis not present

## 2019-01-16 DIAGNOSIS — R7303 Prediabetes: Secondary | ICD-10-CM | POA: Diagnosis not present

## 2019-01-16 DIAGNOSIS — Z79899 Other long term (current) drug therapy: Secondary | ICD-10-CM | POA: Diagnosis not present

## 2019-01-18 ENCOUNTER — Ambulatory Visit: Payer: BLUE CROSS/BLUE SHIELD | Admitting: Family Medicine

## 2019-01-18 ENCOUNTER — Encounter: Payer: Self-pay | Admitting: Family Medicine

## 2019-01-18 VITALS — BP 152/86 | Ht 64.0 in | Wt 181.0 lb

## 2019-01-18 DIAGNOSIS — M26629 Arthralgia of temporomandibular joint, unspecified side: Secondary | ICD-10-CM

## 2019-01-18 DIAGNOSIS — I10 Essential (primary) hypertension: Secondary | ICD-10-CM

## 2019-01-18 DIAGNOSIS — R7303 Prediabetes: Secondary | ICD-10-CM

## 2019-01-18 DIAGNOSIS — E785 Hyperlipidemia, unspecified: Secondary | ICD-10-CM | POA: Diagnosis not present

## 2019-01-18 LAB — LIPID PANEL
Chol/HDL Ratio: 2.5 ratio (ref 0.0–4.4)
Cholesterol, Total: 164 mg/dL (ref 100–199)
HDL: 65 mg/dL (ref >39)
LDL Calculated: 87 mg/dL (ref 0–99)
Triglycerides: 59 mg/dL (ref 0–149)
VLDL Cholesterol Cal: 12 mg/dL (ref 5–40)

## 2019-01-18 LAB — CBC WITH DIFFERENTIAL/PLATELET
Basophils Absolute: 0 10*3/uL (ref 0.0–0.2)
Basos: 1 %
EOS (ABSOLUTE): 0.1 10*3/uL (ref 0.0–0.4)
Eos: 2 %
Hematocrit: 38.5 % (ref 34.0–46.6)
Hemoglobin: 12.8 g/dL (ref 11.1–15.9)
Immature Grans (Abs): 0 10*3/uL (ref 0.0–0.1)
Immature Granulocytes: 0 %
Lymphocytes Absolute: 1.9 10*3/uL (ref 0.7–3.1)
Lymphs: 40 %
MCH: 27.6 pg (ref 26.6–33.0)
MCHC: 33.2 g/dL (ref 31.5–35.7)
MCV: 83 fL (ref 79–97)
Monocytes Absolute: 0.3 10*3/uL (ref 0.1–0.9)
Monocytes: 6 %
Neutrophils Absolute: 2.5 10*3/uL (ref 1.4–7.0)
Neutrophils: 51 %
Platelets: 260 10*3/uL (ref 150–450)
RBC: 4.63 x10E6/uL (ref 3.77–5.28)
RDW: 12.5 % (ref 11.7–15.4)
WBC: 4.8 10*3/uL (ref 3.4–10.8)

## 2019-01-18 LAB — BASIC METABOLIC PANEL
BUN/Creatinine Ratio: 14 (ref 12–28)
BUN: 15 mg/dL (ref 8–27)
CO2: 23 mmol/L (ref 20–29)
Calcium: 9.2 mg/dL (ref 8.7–10.3)
Chloride: 104 mmol/L (ref 96–106)
Creatinine, Ser: 1.04 mg/dL — ABNORMAL HIGH (ref 0.57–1.00)
GFR calc Af Amer: 67 mL/min/{1.73_m2} (ref >59)
GFR calc non Af Amer: 58 mL/min/{1.73_m2} — ABNORMAL LOW (ref >59)
Glucose: 113 mg/dL — ABNORMAL HIGH (ref 65–99)
Potassium: 4.5 mmol/L (ref 3.5–5.2)
Sodium: 141 mmol/L (ref 134–144)

## 2019-01-18 LAB — HEPATIC FUNCTION PANEL
ALT: 22 IU/L (ref 0–32)
AST: 17 IU/L (ref 0–40)
Albumin: 4.2 g/dL (ref 3.8–4.8)
Alkaline Phosphatase: 53 IU/L (ref 39–117)
Bilirubin Total: 0.4 mg/dL (ref 0.0–1.2)
Bilirubin, Direct: 0.14 mg/dL (ref 0.00–0.40)
Total Protein: 6.3 g/dL (ref 6.0–8.5)

## 2019-01-18 LAB — HEMOGLOBIN A1C
Est. average glucose Bld gHb Est-mCnc: 128 mg/dL
Hgb A1c MFr Bld: 6.1 % — ABNORMAL HIGH (ref 4.8–5.6)

## 2019-01-18 MED ORDER — ROSUVASTATIN CALCIUM 10 MG PO TABS: 10.0000 mg | ORAL_TABLET | Freq: Every day | ORAL | 1 refills | Status: DC

## 2019-01-18 MED ORDER — TIZANIDINE HCL 4 MG PO TABS: 4.0000 mg | ORAL_TABLET | Freq: Every day | ORAL | 1 refills | Status: DC

## 2019-01-18 MED ORDER — LISINOPRIL-HYDROCHLOROTHIAZIDE 20-25 MG PO TABS: 1.0000 | ORAL_TABLET | Freq: Every day | ORAL | 1 refills | Status: DC

## 2019-01-18 NOTE — Progress Notes (Signed)
Subjective:    Patient ID: Daisy Edwards, female    DOB: 06-02-1956, 63 y.o.   MRN: [id number]  Hypertension  This is a chronic problem. The current episode started more than 1 year ago. Risk factors for coronary artery disease include dyslipidemia and post-menopausal state. Treatments tried: lisinopril/hctz. There are no compliance problems.    Blood pressure medicine and blood pressure levels reviewed today with patient. Compliant with blood pressure medicine. States does not miss a dose. No obvious side effects. Blood pressure generally good when checked elsewhere. Watching salt intake.   Results for orders placed or performed in visit on 01/14/19  Lipid panel  Result Value Ref Range   Cholesterol, Total 164 100 - 199 mg/dL   Triglycerides 59 0 - 149 mg/dL   HDL 65 >39 mg/dL   VLDL Cholesterol Cal 12 5 - 40 mg/dL   LDL Calculated 87 0 - 99 mg/dL   Chol/HDL Ratio 2.5 0.0 - 4.4 ratio  Basic metabolic panel  Result Value Ref Range   Glucose 113 (H) 65 - 99 mg/dL   BUN 15 8 - 27 mg/dL   Creatinine, Ser 1.04 (H) 0.57 - 1.00 mg/dL   GFR calc non Af Amer 58 (L) >59 mL/min/1.73   GFR calc Af Amer 67 >59 mL/min/1.73   BUN/Creatinine Ratio 14 12 - 28   Sodium 141 134 - 144 mmol/L   Potassium 4.5 3.5 - 5.2 mmol/L   Chloride 104 96 - 106 mmol/L   CO2 23 20 - 29 mmol/L   Calcium 9.2 8.7 - 10.3 mg/dL  Hepatic function panel  Result Value Ref Range   Total Protein 6.3 6.0 - 8.5 g/dL   Albumin 4.2 3.8 - 4.8 g/dL   Bilirubin Total 0.4 0.0 - 1.2 mg/dL   Bilirubin, Direct 0.14 0.00 - 0.40 mg/dL   Alkaline Phosphatase 53 39 - 117 IU/L   AST 17 0 - 40 IU/L   ALT 22 0 - 32 IU/L  Hemoglobin A1c  Result Value Ref Range   Hgb A1c MFr Bld 6.1 (H) 4.8 - 5.6 %   Est. average glucose Bld gHb Est-mCnc 128 mg/dL  CBC with Differential/Platelet  Result Value Ref Range   WBC 4.8 3.4 - 10.8 x10E3/uL   RBC 4.63 3.77 - 5.28 x10E6/uL   Hemoglobin 12.8 11.1 - 15.9 g/dL   Hematocrit 38.5 34.0 - 46.6  %   MCV 83 79 - 97 fL   MCH 27.6 26.6 - 33.0 pg   MCHC 33.2 31.5 - 35.7 g/dL   RDW 12.5 11.7 - 15.4 %   Platelets 260 150 - 450 x10E3/uL   Neutrophils 51 Not Estab. %   Lymphs 40 Not Estab. %   Monocytes 6 Not Estab. %   Eos 2 Not Estab. %   Basos 1 Not Estab. %   Neutrophils Absolute 2.5 1.4 - 7.0 x10E3/uL   Lymphocytes Absolute 1.9 0.7 - 3.1 x10E3/uL   Monocytes Absolute 0.3 0.1 - 0.9 x10E3/uL   EOS (ABSOLUTE) 0.1 0.0 - 0.4 x10E3/uL   Basophils Absolute 0.0 0.0 - 0.2 x10E3/uL   Immature Granulocytes 0 Not Estab. %   Immature Grans (Abs) 0.0 0.0 - 0.1 x10E3/uL    Blood pressure medicine and blood pressure levels reviewed today with patient. Compliant with blood pressure medicine. States does not miss a dose. No obvious side effects. Blood pressure generally good when checked elsewhere. Watching salt intake.  Patient continues to take lipid medication regularly. No obvious  side effects from it. Generally does not miss a dose. Prior blood work results are reviewed with patient. Patient continues to work on fat intake in diet  Pos fam hx of  Pain and discomfort ,  No idea, pain off an on ,  Ear ache noted ,, Patient has been having bilateral ear pain for a month.  Prediabetes has ha d a lot of celeration with sugar intakie    Review of Systems  All other systems reviewed and are negative. No headache, no major weight loss or weight gain, no chest pain no back pain abdominal pain no change in bowel habits complete ROS otherwise negative      Objective:   Physical Exam  Alert and oriented, vitals reviewed and stable, NAD ENT-TM's and ext canals WNL bilat via otoscopic exam Soft palate, tonsils and post pharynx WNL via oropharyngeal exam Neck-symmetric, no masses; thyroid nonpalpable and nontender Pulmonary-no tachypnea or accessory muscle use; Clear without wheezes via auscultation Card--no abnrml murmurs, rhythm reg and rate WNL Carotid pulses symmetric, without  bruits Patient does have bilateral TMJ tenderness to deep palpation      Assessment & Plan:  Impression 1 hypertension.  Fair control but not perfect.  Discussed.  Self monitor encouraged.  Numbers overall elevated past 6 months options given.  To maintain same pending follow-up in 3 months  2.  Hyperlipidemia.  Results discussed maintain same meds diet discussed  3.  Prediabetes.  A1c remains at 6.1.  Diet discussed exercise discussed in this regard  4.  TMJ syndrome.  Bilateral preauricular pain with tenderness at TMJ.  Patient does chew some gum.  Encouraged not to.  Will try a nightly muscle relaxant  Follow-up in several months for reassessment of blood pressure if still elevated

## 2019-01-18 NOTE — Patient Instructions (Addendum)
Omni 5    [employer] digital car apoth  Check a couple blood pressures per week at different times and record   If top number say is 165 or higher or bottom is 100 or higher on two separate b p cks over couple days , give [place] a call

## 2019-02-10 ENCOUNTER — Other Ambulatory Visit: Payer: Self-pay | Admitting: Family Medicine

## 2019-03-06 ENCOUNTER — Other Ambulatory Visit: Payer: Self-pay | Admitting: Family Medicine

## 2019-03-22 ENCOUNTER — Other Ambulatory Visit: Payer: Self-pay | Admitting: Family Medicine

## 2019-03-23 ENCOUNTER — Other Ambulatory Visit: Payer: Self-pay

## 2019-03-23 MED ORDER — TIZANIDINE HCL 4 MG PO TABS: ORAL_TABLET | ORAL | 1 refills | Status: DC

## 2019-04-20 ENCOUNTER — Other Ambulatory Visit: Payer: Self-pay

## 2019-04-20 ENCOUNTER — Ambulatory Visit: Payer: BC Managed Care – PPO | Admitting: Family Medicine

## 2019-05-04 ENCOUNTER — Other Ambulatory Visit: Payer: Self-pay

## 2019-05-04 ENCOUNTER — Ambulatory Visit (INDEPENDENT_AMBULATORY_CARE_PROVIDER_SITE_OTHER): Payer: BC Managed Care – PPO | Admitting: Family Medicine

## 2019-05-04 DIAGNOSIS — E785 Hyperlipidemia, unspecified: Secondary | ICD-10-CM | POA: Diagnosis not present

## 2019-05-04 DIAGNOSIS — I10 Essential (primary) hypertension: Secondary | ICD-10-CM

## 2019-05-04 DIAGNOSIS — F411 Generalized anxiety disorder: Secondary | ICD-10-CM | POA: Diagnosis not present

## 2019-05-04 MED ORDER — ROSUVASTATIN CALCIUM 10 MG PO TABS: 10.0000 mg | ORAL_TABLET | Freq: Every day | ORAL | 1 refills | Status: DC

## 2019-05-04 MED ORDER — LISINOPRIL-HYDROCHLOROTHIAZIDE 20-25 MG PO TABS: 1.0000 | ORAL_TABLET | Freq: Every day | ORAL | 1 refills | Status: DC

## 2019-05-04 MED ORDER — AMLODIPINE BESYLATE 5 MG PO TABS: 5.0000 mg | ORAL_TABLET | Freq: Every day | ORAL | 1 refills | Status: DC

## 2019-05-04 MED ORDER — CLONAZEPAM 0.5 MG PO TABS: 0.5000 mg | ORAL_TABLET | Freq: Every day | ORAL | 1 refills | Status: DC | PRN

## 2019-05-04 NOTE — Progress Notes (Signed)
   Subjective:    Patient ID: Daisy Edwards, female    DOB: 1956/06/27, 63 y.o.   MRN: [id number] Audio plus video  Patient seen for multiple concerns Hypertension This is a chronic problem. The current episode started more than 1 year ago. Risk factors for coronary artery disease include post-menopausal state and dyslipidemia. Treatments tried: zestoretic.   Patient states she is having anxiety and trouble sleeping related to covid   Patient has had noticeable progressive anxiety.  Intermittent in nature.  When it occurs it can affect breathing and heart rate.  No true panic attacks.  Patient claims no significant depression  Patient feels less distress is due to the pandemic.  Also notes substantial trouble sleeping at times.    Review of Systems No headache, no major weight loss or weight gain, no chest pain no back pain abdominal pain no change in bowel habits complete ROS otherwise negative     Objective:   Physical Exam   Virtual     Assessment & Plan:  Impression hypertension.  Blood pressure good when checked elsewhere compliance discussed patient maintain same medication  2.  Generalized anxiety disorder.  With substantial insomnia.  Intermittent.  Long discussion held.  Options discussed.  Will use PRN clonazepam  3.  Hyperlipidemia.  Compliance discussed diet discussed

## 2019-05-15 ENCOUNTER — Encounter: Payer: Self-pay | Admitting: Family Medicine

## 2019-05-19 ENCOUNTER — Other Ambulatory Visit: Payer: Self-pay

## 2019-05-19 DIAGNOSIS — Z20822 Contact with and (suspected) exposure to covid-19: Secondary | ICD-10-CM

## 2019-05-27 LAB — NOVEL CORONAVIRUS, NAA: SARS-CoV-2, NAA: NOT DETECTED

## 2019-11-06 ENCOUNTER — Other Ambulatory Visit: Payer: Self-pay | Admitting: Family Medicine

## 2019-11-16 ENCOUNTER — Ambulatory Visit: Payer: BC Managed Care – PPO | Attending: Internal Medicine

## 2019-11-16 ENCOUNTER — Other Ambulatory Visit: Payer: Self-pay

## 2019-11-16 DIAGNOSIS — Z20822 Contact with and (suspected) exposure to covid-19: Secondary | ICD-10-CM

## 2019-11-17 LAB — NOVEL CORONAVIRUS, NAA: SARS-CoV-2, NAA: NOT DETECTED

## 2019-11-22 ENCOUNTER — Other Ambulatory Visit: Payer: Self-pay | Admitting: Family Medicine

## 2019-11-22 ENCOUNTER — Other Ambulatory Visit: Payer: Self-pay | Admitting: Cardiology

## 2019-11-23 NOTE — Telephone Encounter (Signed)
Pt will call back when she gets off work to schedule virtual visit

## 2019-11-23 NOTE — Telephone Encounter (Signed)
Please schedule visit and then route back to nurses to send in one refil

## 2019-12-02 NOTE — Telephone Encounter (Signed)
lvm to schedule virtual appt

## 2019-12-23 ENCOUNTER — Encounter: Payer: Self-pay | Admitting: Family Medicine

## 2019-12-23 ENCOUNTER — Ambulatory Visit: Payer: Self-pay | Admitting: Family Medicine

## 2019-12-24 ENCOUNTER — Ambulatory Visit: Payer: Self-pay | Admitting: Family Medicine

## 2019-12-27 ENCOUNTER — Other Ambulatory Visit: Payer: Self-pay

## 2019-12-27 ENCOUNTER — Ambulatory Visit (INDEPENDENT_AMBULATORY_CARE_PROVIDER_SITE_OTHER): Payer: 59 | Admitting: Family Medicine

## 2019-12-27 DIAGNOSIS — E785 Hyperlipidemia, unspecified: Secondary | ICD-10-CM | POA: Diagnosis not present

## 2019-12-27 DIAGNOSIS — I10 Essential (primary) hypertension: Secondary | ICD-10-CM | POA: Diagnosis not present

## 2019-12-27 DIAGNOSIS — R7303 Prediabetes: Secondary | ICD-10-CM

## 2019-12-27 DIAGNOSIS — Z79899 Other long term (current) drug therapy: Secondary | ICD-10-CM | POA: Diagnosis not present

## 2019-12-27 DIAGNOSIS — F411 Generalized anxiety disorder: Secondary | ICD-10-CM

## 2019-12-27 MED ORDER — CLONAZEPAM 0.5 MG PO TABS: 0.5000 mg | ORAL_TABLET | Freq: Every day | ORAL | 5 refills | Status: DC | PRN

## 2019-12-27 MED ORDER — TIZANIDINE HCL 4 MG PO TABS: ORAL_TABLET | ORAL | 1 refills | Status: DC

## 2019-12-27 MED ORDER — ROSUVASTATIN CALCIUM 10 MG PO TABS: 10.0000 mg | ORAL_TABLET | Freq: Every day | ORAL | 1 refills | Status: DC

## 2019-12-27 MED ORDER — LISINOPRIL-HYDROCHLOROTHIAZIDE 20-25 MG PO TABS: 1.0000 | ORAL_TABLET | Freq: Every day | ORAL | 1 refills | Status: DC

## 2019-12-27 MED ORDER — DICLOFENAC SODIUM 75 MG PO TBEC: DELAYED_RELEASE_TABLET | ORAL | 2 refills | Status: DC

## 2019-12-27 MED ORDER — AMLODIPINE BESYLATE 5 MG PO TABS: ORAL_TABLET | ORAL | 1 refills | Status: DC

## 2019-12-27 NOTE — Progress Notes (Signed)
   Subjective:  Audio only patient calls for numerous concerns  Patient ID: Daisy Edwards, female    DOB: 1956-04-08, 64 y.o.   MRN: [id number]  Hypertension This is a chronic problem. There are no compliance problems.    Pt here for med check. Pt states she is taking her medications as prescribed.  Pt states she is having some right hip and leg discomfort. Has had this for a while and it is getting more intense.  Virtual Visit via Telephone Note  I connected with Syliva Overman on 12/27/19 at  3:00 PM EST by telephone and verified that I am speaking with the correct person using two identifiers.  Location: Patient: home Provider: office   I discussed the limitations, risks, security and privacy concerns of performing an evaluation and management service by telephone and the availability of in person appointments. I also discussed with the patient that there may be a patient responsible charge related to this service. The patient expressed understanding and agreed to proceed.   History of Present Illness:    Observations/Objective:   Assessment and Plan:   Follow Up Instructions:    I discussed the assessment and treatment plan with the patient. The patient was provided an opportunity to ask questions and all were answered. The patient agreed with the plan and demonstrated an understanding of the instructions.   The patient was advised to call back or seek an in-person evaluation if the symptoms worsen or if the condition fails to improve as anticipated.  I provided 30 minutes of non-face-to-face time during this encounter.  Not much exercise  Works at an [employer], stands a lot  Lower back and right hip discomfort, radiates all the way into the leg   Takes tyk as needed  Blood pressure medicine and blood pressure levels reviewed today with patient. Compliant with blood pressure medicine. States does not miss a dose. No obvious side effects. Blood pressure generally  good when checked elsewhere. Watching salt intake.   Patient continues to take lipid medication regularly. No obvious side effects from it. Generally does not miss a dose. Prior blood work results are reviewed with patient. Patient continues to work on fat intake in diet  Insomnia/anxiety ongoing.  Definitely benefits from the medication.  Uses it as needed.  Exercising a fair amount  Review of Systems No headache no chest pain no shortness of breath    Objective:   Physical Exam   Virtual     Assessment & Plan:  Impression hypertension apparent good control discussed.  Compliant discussed  2.  Hyperlipidemia status uncertain need to check blood work  3.  Generalized anxiety.  Ongoing.  With element of insomnia benefits of medicines meds refilled  4.  Leg pain.  Nonspecific.  Musculoskeletal with some sciatic features.  Trial of anti-inflammatory medicine as needed.  Exercises encouraged

## 2019-12-28 ENCOUNTER — Other Ambulatory Visit (HOSPITAL_COMMUNITY): Payer: Self-pay | Admitting: Family Medicine

## 2019-12-28 DIAGNOSIS — Z1231 Encounter for screening mammogram for malignant neoplasm of breast: Secondary | ICD-10-CM

## 2020-01-01 LAB — CBC WITH DIFFERENTIAL/PLATELET
Basophils Absolute: 0 10*3/uL (ref 0.0–0.2)
Basos: 1 %
EOS (ABSOLUTE): 0.1 10*3/uL (ref 0.0–0.4)
Eos: 1 %
Hematocrit: 40.6 % (ref 34.0–46.6)
Hemoglobin: 13.8 g/dL (ref 11.1–15.9)
Immature Grans (Abs): 0 10*3/uL (ref 0.0–0.1)
Immature Granulocytes: 0 %
Lymphocytes Absolute: 2.5 10*3/uL (ref 0.7–3.1)
Lymphs: 42 %
MCH: 27.9 pg (ref 26.6–33.0)
MCHC: 34 g/dL (ref 31.5–35.7)
MCV: 82 fL (ref 79–97)
Monocytes Absolute: 0.3 10*3/uL (ref 0.1–0.9)
Monocytes: 6 %
Neutrophils Absolute: 3.1 10*3/uL (ref 1.4–7.0)
Neutrophils: 50 %
Platelets: 269 10*3/uL (ref 150–450)
RBC: 4.94 x10E6/uL (ref 3.77–5.28)
RDW: 12.5 % (ref 11.7–15.4)
WBC: 6 10*3/uL (ref 3.4–10.8)

## 2020-01-01 LAB — HEPATIC FUNCTION PANEL
ALT: 26 IU/L (ref 0–32)
AST: 19 IU/L (ref 0–40)
Albumin: 4.8 g/dL (ref 3.8–4.8)
Alkaline Phosphatase: 64 IU/L (ref 39–117)
Bilirubin Total: 0.4 mg/dL (ref 0.0–1.2)
Bilirubin, Direct: 0.13 mg/dL (ref 0.00–0.40)
Total Protein: 7.3 g/dL (ref 6.0–8.5)

## 2020-01-01 LAB — LIPID PANEL
Chol/HDL Ratio: 2.3 ratio (ref 0.0–4.4)
Cholesterol, Total: 203 mg/dL — ABNORMAL HIGH (ref 100–199)
HDL: 89 mg/dL (ref >39)
LDL Chol Calc (NIH): 102 mg/dL — ABNORMAL HIGH (ref 0–99)
Triglycerides: 68 mg/dL (ref 0–149)
VLDL Cholesterol Cal: 12 mg/dL (ref 5–40)

## 2020-01-01 LAB — BASIC METABOLIC PANEL
BUN/Creatinine Ratio: 16 (ref 12–28)
BUN: 16 mg/dL (ref 8–27)
CO2: 26 mmol/L (ref 20–29)
Calcium: 10 mg/dL (ref 8.7–10.3)
Chloride: 102 mmol/L (ref 96–106)
Creatinine, Ser: 1.03 mg/dL — ABNORMAL HIGH (ref 0.57–1.00)
GFR calc Af Amer: 67 mL/min/{1.73_m2} (ref >59)
GFR calc non Af Amer: 58 mL/min/{1.73_m2} — ABNORMAL LOW (ref >59)
Glucose: 97 mg/dL (ref 65–99)
Potassium: 4 mmol/L (ref 3.5–5.2)
Sodium: 142 mmol/L (ref 134–144)

## 2020-01-01 LAB — HEMOGLOBIN A1C
Est. average glucose Bld gHb Est-mCnc: 134 mg/dL
Hgb A1c MFr Bld: 6.3 % — ABNORMAL HIGH (ref 4.8–5.6)

## 2020-01-02 ENCOUNTER — Encounter: Payer: Self-pay | Admitting: Family Medicine

## 2020-01-03 ENCOUNTER — Ambulatory Visit (HOSPITAL_COMMUNITY)
Admission: RE | Admit: 2020-01-03 | Discharge: 2020-01-03 | Disposition: A | Discharge status: Home / Self Care | Payer: 59 | Source: Ambulatory Visit | Attending: Family Medicine | Admitting: Family Medicine

## 2020-01-03 ENCOUNTER — Other Ambulatory Visit: Payer: Self-pay

## 2020-01-03 DIAGNOSIS — Z1231 Encounter for screening mammogram for malignant neoplasm of breast: Secondary | ICD-10-CM | POA: Diagnosis not present

## 2020-01-29 ENCOUNTER — Ambulatory Visit: Payer: 59 | Attending: Internal Medicine

## 2020-01-29 DIAGNOSIS — Z23 Encounter for immunization: Secondary | ICD-10-CM

## 2020-01-29 NOTE — Progress Notes (Signed)
   Covid-19 Vaccination Clinic  Name:  Daisy Edwards    MRN: [id number] DOB: 05/28/1956  01/29/2020  Ms. Daisy Edwards was observed post Covid-19 immunization for 15 minutes without incident. She was provided with Vaccine Information Sheet and instruction to access the V-Safe system.   Ms. Daisy Edwards was instructed to call 911 with any severe reactions post vaccine: Marland Kitchen Difficulty breathing  . Swelling of face and throat  . A fast heartbeat  . A bad rash all over body  . Dizziness and weakness   Immunizations Administered    Name Date Dose VIS Date Route   Moderna COVID-19 Vaccine 01/29/2020 10:01 AM 0.5 mL 10/19/2019 Intramuscular   Manufacturer: Moderna   Lot: [id number]   [id number]

## 2020-02-15 ENCOUNTER — Other Ambulatory Visit: Payer: BC Managed Care – PPO | Admitting: Adult Health

## 2020-03-01 ENCOUNTER — Ambulatory Visit: Payer: Self-pay | Attending: Internal Medicine

## 2020-03-01 DIAGNOSIS — Z23 Encounter for immunization: Secondary | ICD-10-CM

## 2020-03-01 NOTE — Progress Notes (Signed)
   Covid-19 Vaccination Clinic  Name:  Daisy Edwards    MRN: [id number] DOB: March 12, 1956  03/01/2020  Daisy Edwards was observed post Covid-19 immunization for 15 minutes without incident. She was provided with Vaccine Information Sheet and instruction to access the V-Safe system.   Daisy Edwards was instructed to call 911 with any severe reactions post vaccine: Marland Kitchen Difficulty breathing  . Swelling of face and throat  . A fast heartbeat  . A bad rash all over body  . Dizziness and weakness   Immunizations Administered    Name Date Dose VIS Date Route   Moderna COVID-19 Vaccine 03/01/2020  9:16 AM 0.5 mL 10/19/2019 Intramuscular   Manufacturer: Moderna   Lot: [id number]   [id number]

## 2020-05-15 ENCOUNTER — Other Ambulatory Visit: Payer: Self-pay | Admitting: Cardiology

## 2020-06-20 ENCOUNTER — Other Ambulatory Visit: Payer: Self-pay

## 2020-06-20 MED ORDER — LISINOPRIL-HYDROCHLOROTHIAZIDE 20-25 MG PO TABS: 1.0000 | ORAL_TABLET | Freq: Every day | ORAL | 0 refills | Status: DC

## 2020-06-23 ENCOUNTER — Telehealth: Payer: Self-pay | Admitting: Family Medicine

## 2020-06-23 MED ORDER — ROSUVASTATIN CALCIUM 10 MG PO TABS: 10.0000 mg | ORAL_TABLET | Freq: Every day | ORAL | 0 refills | Status: DC

## 2020-06-23 NOTE — Telephone Encounter (Signed)
Rosuvastatin 10 mg sent to [facility]. Left message to return call

## 2020-06-23 NOTE — Telephone Encounter (Signed)
Please advise. Thank you

## 2020-06-23 NOTE — Telephone Encounter (Signed)
May have 90-day with 1 refill

## 2020-06-23 NOTE — Telephone Encounter (Signed)
Patient is requesting to change pharmacy for her rosuvastatin 10 mg to [place] for 90 day supply instead of 30 day because its cheaper through her insurance. Please advise

## 2020-06-26 ENCOUNTER — Other Ambulatory Visit: Payer: Self-pay | Admitting: Cardiology

## 2020-06-26 MED ORDER — RANOLAZINE ER 500 MG PO TB12: 500.0000 mg | ORAL_TABLET | Freq: Two times a day (BID) | ORAL | 1 refills | Status: DC

## 2020-06-26 NOTE — Telephone Encounter (Signed)
NEW MESSAGE     *STAT* If patient is at the pharmacy, call can be transferred to refill team.   1. Which medications need to be refilled? (please list name of each medication and dose if known) ranolazine (RANEXA) 500 MG 12 hr tablet  2. Which pharmacy/location (including street and city if local pharmacy) is medication to be sent to [facility] in [place]  3. Do they need a 30 day or 90 day supply? [place]

## 2020-06-26 NOTE — Telephone Encounter (Signed)
Refilled ranexa

## 2020-07-14 ENCOUNTER — Other Ambulatory Visit: Payer: Self-pay | Admitting: Family Medicine

## 2020-07-17 NOTE — Telephone Encounter (Signed)
lvm to schedule appt.  

## 2020-07-18 NOTE — Telephone Encounter (Signed)
Patient scheduled an appt for October 27th due to starting a new job and she won't have insurance until then.  She was hoping to get a 90 days supply of Lisinopril to last until her appt.  Patient is going to call around to see if she can find a cheaper price on medicine but she said we can call it in to [facility] and she will transfer it if she needs to.

## 2020-09-11 ENCOUNTER — Telehealth: Payer: Self-pay | Admitting: *Deleted

## 2020-09-11 MED ORDER — AMLODIPINE BESYLATE 5 MG PO TABS: ORAL_TABLET | ORAL | 0 refills | Status: DC

## 2020-09-11 NOTE — Telephone Encounter (Signed)
Patient called stating she has an appt for HTN follow up 09/25/20 but is out of her Norvasc and needs a script till her appt. Prescription sent electronically to pharmacy and patient will keep follow up appt.

## 2020-09-13 ENCOUNTER — Ambulatory Visit: Payer: 59 | Admitting: Family Medicine

## 2020-09-18 ENCOUNTER — Other Ambulatory Visit: Payer: Self-pay | Admitting: Family Medicine

## 2020-09-25 ENCOUNTER — Ambulatory Visit: Payer: 59 | Admitting: Family Medicine

## 2020-10-10 ENCOUNTER — Other Ambulatory Visit: Payer: Self-pay

## 2020-10-10 ENCOUNTER — Encounter: Payer: Self-pay | Admitting: Family Medicine

## 2020-10-10 ENCOUNTER — Ambulatory Visit (INDEPENDENT_AMBULATORY_CARE_PROVIDER_SITE_OTHER): Payer: BC Managed Care – PPO | Admitting: Family Medicine

## 2020-10-10 VITALS — BP 132/74 | HR 85 | Temp 97.7°F | Ht 64.0 in | Wt 163.0 lb

## 2020-10-10 DIAGNOSIS — M25551 Pain in right hip: Secondary | ICD-10-CM

## 2020-10-10 DIAGNOSIS — I10 Essential (primary) hypertension: Secondary | ICD-10-CM

## 2020-10-10 DIAGNOSIS — H6123 Impacted cerumen, bilateral: Secondary | ICD-10-CM

## 2020-10-10 DIAGNOSIS — E785 Hyperlipidemia, unspecified: Secondary | ICD-10-CM

## 2020-10-10 DIAGNOSIS — G8929 Other chronic pain: Secondary | ICD-10-CM

## 2020-10-10 MED ORDER — AMLODIPINE BESYLATE 5 MG PO TABS
ORAL_TABLET | ORAL | 1 refills | Status: DC
Start: 2020-10-10 — End: 2021-03-26

## 2020-10-10 MED ORDER — LISINOPRIL-HYDROCHLOROTHIAZIDE 20-25 MG PO TABS
1.0000 | ORAL_TABLET | Freq: Every day | ORAL | 1 refills | Status: DC
Start: 2020-10-10 — End: 2020-12-19

## 2020-10-10 MED ORDER — DICLOFENAC SODIUM 75 MG PO TBEC
DELAYED_RELEASE_TABLET | ORAL | 2 refills | Status: DC
Start: 2020-10-10 — End: 2021-01-29

## 2020-10-10 MED ORDER — ROSUVASTATIN CALCIUM 10 MG PO TABS
ORAL_TABLET | ORAL | 1 refills | Status: DC
Start: 2020-10-10 — End: 2021-03-26

## 2020-10-10 NOTE — Patient Instructions (Signed)
Debrox ear drops for 5-7 days at night.   Call or return if unable to get it out of ear.

## 2020-10-10 NOTE — Progress Notes (Signed)
Patient ID: Daisy Edwards, female    DOB: [birth year], 64 y.o.   MRN: [id number]   Chief Complaint  Patient presents with  . Hypertension  . Hyperlipidemia   Subjective:    HPI   Pt f/u for htn and HLD.  Has been staying well, no covid illness in past year.  Doing well with htn and hld meds.  Right hip pain for 6-9 months.  Feeling worsening in past 3-4 months.  Started new job and walking more. Hard concrete floor.  Try to wear different shoes. Wearing sneakers. Started ot have back pain. No previous injury. meds- not on meds. Using topical cream in past. Has had this pain in 2/21 per notes and received diclofenac from last pcp. Not seen by ortho.  Tried diclofenac in past, not liking taking meds. 6/10 when worst pain. With rest it resolves somewhat.  Noticing -tenderness with touching it or if water hits it from shower.   Medical History Daisy Edwards has a past medical history of Anemia, Anxiety, Arthritis, Back pain, chronic, Carpal tunnel syndrome on both sides, Cervical vertebral fusion, Chronic leg pain, Elevated cholesterol, Essential hypertension, Hyperlipidemia, and Impaired fasting glucose.   Outpatient Encounter Medications as of 10/10/2020  Medication Sig  . amLODipine (NORVASC) 5 MG tablet TAKE 1 TABLET BY MOUTH EVERYDAY AT BEDTIME  . aspirin 81 MG tablet Take 81 mg by mouth daily.  . Cholecalciferol 2000 units CAPS Take 1 daily (Patient taking differently: Take 1 capsule daily by mouth. Take 1 daily)  . clonazePAM (KLONOPIN) 0.5 MG tablet Take 1 tablet (0.5 mg total) by mouth daily as needed for anxiety.  . diclofenac (VOLTAREN) 75 MG EC tablet TAKE ONE TABLET PO BID WITH FOOD  . lisinopril-hydrochlorothiazide (ZESTORETIC) 20-25 MG tablet Take 1 tablet by mouth daily.  . Multiple Vitamin (MULITIVITAMIN WITH MINERALS) TABS Take 1 tablet by mouth daily.  . Omega-3 Fatty Acids (FISH OIL TRIPLE STRENGTH PO) Take 1,500 mg by mouth daily.   . ranolazine (RANEXA)  500 MG 12 hr tablet Take 1 tablet (500 mg total) by mouth 2 (two) times daily.  . rosuvastatin (CRESTOR) 10 MG tablet TAKE 1 TABLET BY MOUTH ONCE DAILY FOR CHOLESTEROL  . [DISCONTINUED] amLODipine (NORVASC) 5 MG tablet TAKE 1 TABLET BY MOUTH EVERYDAY AT BEDTIME  . [DISCONTINUED] diclofenac (VOLTAREN) 75 MG EC tablet TAKE ONE TABLET PO BID WITH FOOD  . [DISCONTINUED] lisinopril-hydrochlorothiazide (ZESTORETIC) 20-25 MG tablet TAKE 1 TABLET BY MOUTH EVERY DAY  . [DISCONTINUED] rosuvastatin (CRESTOR) 10 MG tablet TAKE 1 TABLET BY MOUTH ONCE DAILY FOR CHOLESTEROL  . [DISCONTINUED] tiZANidine (ZANAFLEX) 4 MG tablet TAKE 1 TABLET BY MOUTH EVERYDAY AT BEDTIME   No facility-administered encounter medications on file as of 10/10/2020.     Review of Systems  Constitutional: Negative for chills and fever.  HENT: Negative for congestion, rhinorrhea and sore throat.   Respiratory: Negative for cough, shortness of breath and wheezing.   Cardiovascular: Negative for chest pain and leg swelling.  Gastrointestinal: Negative for abdominal pain, diarrhea, nausea and vomiting.  Genitourinary: Negative for dysuria and frequency.  Musculoskeletal: Positive for arthralgias (rt hip pain). Negative for back pain.  Skin: Negative for rash.  Neurological: Negative for dizziness, weakness and headaches.     Vitals BP 132/74   Pulse 85   Temp 97.7 F (36.5 C)   Ht 5' 4" (1.626 m)   Wt 163 lb (73.9 kg)   SpO2 99%   BMI 27.98 kg/m   Objective:  Physical Exam Vitals and nursing note reviewed.  Constitutional:      General: Daisy Edwards is not in acute distress.    Appearance: Normal appearance. Daisy Edwards is not ill-appearing.  HENT:     Head: Normocephalic and atraumatic.     Right Ear: External ear normal. There is impacted cerumen.     Left Ear: External ear normal. There is impacted cerumen.     Nose: Nose normal.     Mouth/Throat:     Mouth: Mucous membranes are moist.     Pharynx: Oropharynx is clear.   Eyes:     Extraocular Movements: Extraocular movements intact.     Conjunctiva/sclera: Conjunctivae normal.     Pupils: Pupils are equal, round, and reactive to light.  Cardiovascular:     Rate and Rhythm: Normal rate and regular rhythm.     Pulses: Normal pulses.     Heart sounds: Normal heart sounds.  Pulmonary:     Effort: Pulmonary effort is normal.     Breath sounds: Normal breath sounds. No wheezing, rhonchi or rales.  Musculoskeletal:        General: Normal range of motion.     Right lower leg: No edema.     Left lower leg: No edema.     Comments: +pain in right hip and radiating toward the groin with external rotation. +slr on rt.  Neg-SLR on left.  No ttp over lumbar spines. Normal inspection of back.  Skin:    General: Skin is warm and dry.     Findings: No lesion or rash.  Neurological:     General: No focal deficit present.     Mental Status: Daisy Edwards is alert and oriented to person, place, and time.  Psychiatric:        Mood and Affect: Mood normal.        Behavior: Behavior normal.      Assessment and Plan   1. Essential hypertension, benign - CBC - CMP14+EGFR - Lipid panel  2. Hyperlipidemia, unspecified hyperlipidemia type - Lipid panel  3. Chronic hip pain, right  4. Bilateral impacted cerumen   uptodate on covid vaccine and booster And flu vaccine.  answered questions about shingrix and pt can get it at pharmacy.  Rt hip pain- Xray ordered for rt hip. Pt to get in the next week. Recommending taking diclofenac bid for 7-10 days with food or tyelnol prn. Topical lotion okay also.  htn- suboptimal.  Dec salt intake. Cont with amlodipine, lisinopril/hctz.   hld- stable. Controlled. Cont crestor.  Cerumen impaction- recommending otc debrox solution.  Fu [address] prn.

## 2020-10-11 LAB — CBC
Hematocrit: 40.9 % (ref 34.0–46.6)
Hemoglobin: 13.5 g/dL (ref 11.1–15.9)
MCH: 27.3 pg (ref 26.6–33.0)
MCHC: 33 g/dL (ref 31.5–35.7)
MCV: 83 fL (ref 79–97)
Platelets: 262 10*3/uL (ref 150–450)
RBC: 4.94 x10E6/uL (ref 3.77–5.28)
RDW: 12.8 % (ref 11.7–15.4)
WBC: 5.5 10*3/uL (ref 3.4–10.8)

## 2020-10-11 LAB — CMP14+EGFR
ALT: 23 IU/L (ref 0–32)
AST: 16 IU/L (ref 0–40)
Albumin/Globulin Ratio: 1.9 (ref 1.2–2.2)
Albumin: 4.6 g/dL (ref 3.8–4.8)
Alkaline Phosphatase: 75 IU/L (ref 44–121)
BUN/Creatinine Ratio: 19 (ref 12–28)
BUN: 18 mg/dL (ref 8–27)
Bilirubin Total: 0.4 mg/dL (ref 0.0–1.2)
CO2: 25 mmol/L (ref 20–29)
Calcium: 9.8 mg/dL (ref 8.7–10.3)
Chloride: 102 mmol/L (ref 96–106)
Creatinine, Ser: 0.93 mg/dL (ref 0.57–1.00)
GFR calc Af Amer: 76 mL/min/{1.73_m2} (ref >59)
GFR calc non Af Amer: 66 mL/min/{1.73_m2} (ref >59)
Globulin, Total: 2.4 g/dL (ref 1.5–4.5)
Glucose: 131 mg/dL — ABNORMAL HIGH (ref 65–99)
Potassium: 4.4 mmol/L (ref 3.5–5.2)
Sodium: 143 mmol/L (ref 134–144)
Total Protein: 7 g/dL (ref 6.0–8.5)

## 2020-10-11 LAB — LIPID PANEL
Chol/HDL Ratio: 2.9 ratio (ref 0.0–4.4)
Cholesterol, Total: 215 mg/dL — ABNORMAL HIGH (ref 100–199)
HDL: 75 mg/dL (ref >39)
LDL Chol Calc (NIH): 125 mg/dL — ABNORMAL HIGH (ref 0–99)
Triglycerides: 87 mg/dL (ref 0–149)
VLDL Cholesterol Cal: 15 mg/dL (ref 5–40)

## 2020-10-15 ENCOUNTER — Encounter: Payer: Self-pay | Admitting: Family Medicine

## 2020-10-26 ENCOUNTER — Other Ambulatory Visit: Payer: Self-pay | Admitting: Adult Health

## 2020-12-04 ENCOUNTER — Other Ambulatory Visit: Payer: Self-pay | Admitting: Adult Health

## 2020-12-17 ENCOUNTER — Telehealth: Payer: Self-pay | Admitting: Family Medicine

## 2020-12-19 NOTE — Telephone Encounter (Signed)
Please contact patient and have her set up appt. Thank you! 

## 2020-12-19 NOTE — Telephone Encounter (Signed)
Needs follow up on bp and meds in 3 months. Dr. Lovena Le

## 2020-12-20 NOTE — Telephone Encounter (Signed)
Patient  Schedule appointment for 6 month for may

## 2020-12-20 NOTE — Telephone Encounter (Signed)
LM to schedule appointment

## 2020-12-25 ENCOUNTER — Other Ambulatory Visit (HOSPITAL_COMMUNITY): Payer: Self-pay | Admitting: Family Medicine

## 2020-12-25 ENCOUNTER — Ambulatory Visit: Payer: BC Managed Care – PPO | Admitting: Family Medicine

## 2020-12-25 DIAGNOSIS — Z1231 Encounter for screening mammogram for malignant neoplasm of breast: Secondary | ICD-10-CM

## 2021-01-08 ENCOUNTER — Other Ambulatory Visit: Payer: Self-pay

## 2021-01-08 ENCOUNTER — Ambulatory Visit (HOSPITAL_COMMUNITY)
Admission: RE | Admit: 2021-01-08 | Discharge: 2021-01-08 | Disposition: A | Discharge status: Home / Self Care | Payer: BC Managed Care – PPO | Source: Ambulatory Visit | Attending: Family Medicine | Admitting: Family Medicine

## 2021-01-08 DIAGNOSIS — Z1231 Encounter for screening mammogram for malignant neoplasm of breast: Secondary | ICD-10-CM | POA: Diagnosis not present

## 2021-01-15 ENCOUNTER — Other Ambulatory Visit: Payer: Self-pay | Admitting: Cardiology

## 2021-01-22 ENCOUNTER — Telehealth: Payer: Self-pay

## 2021-01-22 NOTE — Telephone Encounter (Signed)
Pt states she needs referral for her normal checkup. No having any problems. It has been awhile since she saw him and needed new referral.

## 2021-01-22 NOTE — Telephone Encounter (Signed)
Pt is calling Heart Dr wants a referral from primary care Dr. Heart Dr name is Rozann Lesches    Pt call back [phone] r

## 2021-01-23 ENCOUNTER — Other Ambulatory Visit: Payer: Self-pay | Admitting: *Deleted

## 2021-01-23 DIAGNOSIS — R079 Chest pain, unspecified: Secondary | ICD-10-CM

## 2021-01-23 NOTE — Telephone Encounter (Signed)
First saw her for chest pain and was put on ranexa. Pt states she needs appt to continue to get refills on ranexa.

## 2021-01-23 NOTE — Telephone Encounter (Signed)
What was she seeing cardiology for?  Need the dx code. Thx. Dr. Lovena Le

## 2021-01-23 NOTE — Telephone Encounter (Signed)
Referral put in. Pt is aware

## 2021-01-23 NOTE — Telephone Encounter (Signed)
Yes pls give referral for chest pain/angina. Thx. Dr. Lovena Le

## 2021-01-29 ENCOUNTER — Other Ambulatory Visit: Payer: Self-pay

## 2021-01-29 ENCOUNTER — Ambulatory Visit (INDEPENDENT_AMBULATORY_CARE_PROVIDER_SITE_OTHER): Payer: BC Managed Care – PPO | Admitting: Adult Health

## 2021-01-29 ENCOUNTER — Encounter: Payer: Self-pay | Admitting: Adult Health

## 2021-01-29 VITALS — BP 147/73 | HR 63 | Ht 62.5 in | Wt 164.0 lb

## 2021-01-29 DIAGNOSIS — Z1211 Encounter for screening for malignant neoplasm of colon: Secondary | ICD-10-CM | POA: Diagnosis not present

## 2021-01-29 DIAGNOSIS — Z01419 Encounter for gynecological examination (general) (routine) without abnormal findings: Secondary | ICD-10-CM | POA: Insufficient documentation

## 2021-01-29 LAB — HEMOCCULT GUIAC POC 1CARD (OFFICE): Fecal Occult Blood, POC: NEGATIVE

## 2021-01-29 NOTE — Progress Notes (Signed)
Patient ID: Daisy Edwards, female   DOB: 04/27/1956, 65 y.o.   MRN: [id number] History of Present Illness:  Daisy Edwards is a 65 year old black female, married, sp hysterectomy in for a well woman gyn exam. She is still working. PCP is Dr Lovena Le.  Current Medications, Allergies, Past Medical History, Past Surgical History, Family History and Social History were reviewed in [employer] record.     Review of Systems: Patient denies any headaches, hearing loss, fatigue, blurred vision, shortness of breath, chest pain, abdominal pain, problems with bowel movements, urination, or intercourse(nt having often). No joint pain or mood swings, or vaginal discharge or itching.    Physical Exam:BP (!) 147/73 (BP Location: Left Arm, Patient Position: Sitting, Cuff Size: Normal)   Pulse 63   Ht 5' 2.5" (1.588 m)   Wt 164 lb (74.4 kg)   BMI 29.52 kg/m  General:  Well developed, well nourished, no acute distress Skin:  Warm and dry Neck:  Midline trachea, normal thyroid, good ROM, no lymphadenopathy,no carotid bruits heard Lungs; Clear to auscultation bilaterally Breast:  No dominant palpable mass, retraction, or nipple discharge Cardiovascular: Regular rate and rhythm Abdomen:  Soft, non tender, no hepatosplenomegaly Pelvic:  External genitalia is normal in appearance, no lesions.  The vagina is normal in appearance. Urethra has no lesions or masses. The cervix and uterus are absent.  No adnexal masses or tenderness noted.Bladder is non tender, no masses felt. Rectal: Good sphincter tone, no polyps, or hemorrhoids felt.  Hemoccult negative. Extremities/musculoskeletal:  No swelling or varicosities noted, no clubbing or cyanosis Psych:  No mood changes, alert and cooperative,seems happy AA is 4 Fall risk is low PHQ 9 score is 4 GAD 7 score is 4  Upstream - 01/29/21 0841      Pregnancy Intention Screening   Does the patient want to become pregnant in the next year? N/A    Does  the patient's partner want to become pregnant in the next year? N/A    Would the patient like to discuss contraceptive options today? N/A      Contraception Wrap Up   Current Method Female Sterilization   hyst   End Method Female Sterilization   hyst   Contraception Counseling Provided No         Examination chaperoned by Levy Pupa LPN  Impression and Plan: 1. Encounter for well woman exam with routine gynecological exam Physical in 1 year Labs with PCP Mammogram yearly Colonoscopy per GI  2. Encounter for screening fecal occult blood testing

## 2021-02-05 DIAGNOSIS — I83893 Varicose veins of bilateral lower extremities with other complications: Secondary | ICD-10-CM | POA: Diagnosis not present

## 2021-02-05 DIAGNOSIS — I8312 Varicose veins of left lower extremity with inflammation: Secondary | ICD-10-CM | POA: Diagnosis not present

## 2021-02-05 DIAGNOSIS — I8311 Varicose veins of right lower extremity with inflammation: Secondary | ICD-10-CM | POA: Diagnosis not present

## 2021-02-07 DIAGNOSIS — I8312 Varicose veins of left lower extremity with inflammation: Secondary | ICD-10-CM | POA: Diagnosis not present

## 2021-02-07 DIAGNOSIS — I8311 Varicose veins of right lower extremity with inflammation: Secondary | ICD-10-CM | POA: Diagnosis not present

## 2021-02-12 DIAGNOSIS — H25013 Cortical age-related cataract, bilateral: Secondary | ICD-10-CM | POA: Diagnosis not present

## 2021-02-14 ENCOUNTER — Telehealth: Payer: Self-pay | Admitting: Cardiology

## 2021-02-14 MED ORDER — RANOLAZINE ER 500 MG PO TB12: 500.0000 mg | ORAL_TABLET | Freq: Two times a day (BID) | ORAL | 0 refills | Status: DC

## 2021-02-14 NOTE — Telephone Encounter (Signed)
*  STAT* If patient is at the pharmacy, call can be transferred to refill team.  1. Which medications need to be refilled? (please list name of each medication and dose if known) Ranolazine (RANEXA) 500 MG 12 hr tablet  2. Which pharmacy/location (including street and city if local pharmacy) is medication to be sent to? [facility] in [place]  3. Do they need a 30 day or 90 day supply? Enough to last until her appt w/Dr. Domenic Polite   Patient said the Pharmacy would not refill because patient has not seen Dr. Domenic Polite in so long. She is upset that we have been filling since 2018 but now she had to have PCP send a new referral. Please call her [phone] she will run out 02/16/21.

## 2021-02-14 NOTE — Telephone Encounter (Signed)
Medication refill request approved for 45 days until patients appointment with Dr. Domenic Polite on 03/22/21.

## 2021-03-19 DIAGNOSIS — I8311 Varicose veins of right lower extremity with inflammation: Secondary | ICD-10-CM | POA: Diagnosis not present

## 2021-03-19 DIAGNOSIS — I83811 Varicose veins of right lower extremities with pain: Secondary | ICD-10-CM | POA: Diagnosis not present

## 2021-03-21 ENCOUNTER — Encounter: Payer: Self-pay | Admitting: Cardiology

## 2021-03-21 NOTE — Progress Notes (Signed)
Cardiology Office Note  Date: 03/22/2021   ID: Daisy Edwards, DOB 1956/05/22, MRN [id number]  PCP:  Erven Colla, DO  Cardiologist:  Rozann Lesches, MD Electrophysiologist:  None   Chief Complaint  Patient presents with  . Reestablish cardiology follow-up    History of Present Illness: Daisy Edwards is a 65 y.o. female referred for cardiology consultation by Dr. Lovena Le to reestablish cardiology follow-up.  She was last seen in 2018.  Cardiac history includes suspected small vessel ischemia and exercise-induced diastolic dysfunction without documented obstructive CAD by previous testing.  She presents today stating that she is doing very well overall.  She works at [employer], is on her feet walking around most of the day.  She does not report any exertional chest pain or limiting breathlessness.  Exercise echocardiogram from 2018 is noted below.  She has been on Ranexa for treatment of microvascular angina and small vessel disease.  I reviewed her medications which are outlined below.  She does need a refill on Ranexa.  I personally reviewed her ECG today which shows normal sinus rhythm.  Past Medical History:  Diagnosis Date  . Anemia   . Anxiety    Panic attack in past  . Arthritis    Cervical stenosis, spondylosis   . Back pain, chronic   . Carpal tunnel syndrome on both sides    Right > left  . Cervical vertebral fusion   . Chronic leg pain   . Elevated cholesterol   . Essential hypertension   . Hyperlipidemia   . Impaired fasting glucose   . Microvascular angina [facility])     Past Surgical History:  Procedure Laterality Date  . ABDOMINAL HYSTERECTOMY    . ANTERIOR CERVICAL DECOMP/DISCECTOMY FUSION  01/29/2012   Procedure: ANTERIOR CERVICAL DECOMPRESSION/DISCECTOMY FUSION 2 LEVELS;  Surgeon: Marybelle Killings, MD;  Location: [place];  Service: Orthopedics;  Laterality: N/A;  C3-4, C4-5 Anterior Cervical Discectomy and Fusion, allograft, plate  . CARDIAC CATHETERIZATION      prior to 2008, not sure where it was done, result- wnl   . CHOLECYSTECTOMY     [facility]  . COLONOSCOPY    . COLONOSCOPY N/A 10/06/2017   Procedure: COLONOSCOPY;  Surgeon: Danie Binder, MD;  [facility];  Service: Endoscopy;  Laterality: N/A;  8:30  . POLYPECTOMY  10/06/2017   Procedure: POLYPECTOMY;  Surgeon: Danie Binder, MD;  [facility];  Service: Endoscopy;;  colon  . VEIN SURGERY      Current Outpatient Medications  Medication Sig Dispense Refill  . amLODipine (NORVASC) 5 MG tablet TAKE 1 TABLET BY MOUTH EVERYDAY AT BEDTIME 90 tablet 1  . aspirin 81 MG tablet Take 81 mg by mouth daily.    . Cholecalciferol 2000 units CAPS Take 1 daily (Patient taking differently: Take 1 capsule by mouth daily. Take 1 daily) 30 each   . clonazePAM (KLONOPIN) 0.5 MG tablet Take 1 tablet (0.5 mg total) by mouth daily as needed for anxiety. 24 tablet 5  . lisinopril-hydrochlorothiazide (ZESTORETIC) 20-25 MG tablet TAKE 1 TABLET BY MOUTH EVERY DAY 90 tablet 0  . Multiple Vitamin (MULITIVITAMIN WITH MINERALS) TABS Take 1 tablet by mouth daily.    . Omega-3 Fatty Acids (FISH OIL TRIPLE STRENGTH PO) Take 1,500 mg by mouth daily.     . rosuvastatin (CRESTOR) 10 MG tablet TAKE 1 TABLET BY MOUTH ONCE DAILY FOR CHOLESTEROL 90 tablet 1  . TURMERIC PO Take by mouth.    Marland Kitchen  ranolazine (RANEXA) 500 MG 12 hr tablet Take 1 tablet (500 mg total) by mouth 2 (two) times daily. 90 tablet 3   No current facility-administered medications for this visit.   Allergies:  Patient has no known allergies.   Social History: The patient  reports that she has quit smoking. Her smoking use included cigarettes. She quit after 0.50 years of use. She has never used smokeless tobacco. She reports current alcohol use of about 4.0 standard drinks of alcohol per week. She reports that she does not use drugs.   Family History: The patient's family history includes Aneurysm in her mother; Cancer in her  maternal aunt and maternal grandmother; Diabetes in her maternal aunt and maternal aunt; Heart attack in her father; Heart disease in her father; Hypertension in her mother; SIDS in her son.   ROS: No palpitations or syncope.  Seasonal allergies.  Physical Exam: VS:  BP 122/60 (BP Location: Left Arm)   Pulse 66   Wt 162 lb 8 oz (73.7 kg)   SpO2 97%   BMI 29.25 kg/m , BMI Body mass index is 29.25 kg/m.  Wt Readings from Last 3 Encounters:  03/22/21 162 lb 8 oz (73.7 kg)  01/29/21 164 lb (74.4 kg)  10/10/20 163 lb (73.9 kg)    General: Patient appears comfortable at rest. HEENT: Conjunctiva and lids normal, wearing a mask. Neck: Supple, no elevated JVP or carotid bruits, no thyromegaly. Lungs: Clear to auscultation, nonlabored breathing at rest. Cardiac: Regular rate and rhythm, no S3 or significant systolic murmur, no pericardial rub. Abdomen: Soft, nontender, bowel sounds present. Extremities: No pitting edema, distal pulses 2+. Skin: Warm and dry. Musculoskeletal: No kyphosis. Neuropsychiatric: Alert and oriented x3, affect grossly appropriate.  ECG:  An ECG dated December 2017 was personally reviewed today and demonstrated:  Normal sinus rhythm.  Recent Labwork: 10/10/2020: ALT 23; AST 16; BUN 18; Creatinine, Ser 0.93; Hemoglobin 13.5; Platelets 262; Potassium 4.4; Sodium 143     Component Value Date/Time   CHOL 215 (H) 10/10/2020 1039   TRIG 87 10/10/2020 1039   HDL 75 10/10/2020 1039   CHOLHDL 2.9 10/10/2020 1039   CHOLHDL 2.7 01/30/2015 1429   VLDL 15 01/30/2015 1429   LDLCALC 125 (H) 10/10/2020 1039    Other Studies Reviewed Today:  Exercise echocardiogram 12/30/2016: Study Conclusions   - Impressions: Abnormal exercise EKG portion of stress study, with  1 mm ST depression in inferior leads  Normal imaging portion of stress study, with hyperdynamic  response of all segments with cavity obliteration. [facility] treadmill  score of 4 supports low to  intermediate risk of major cardiac  events.   Impressions:   - Abnormal exercise EKG portion of stress study, with 1 mm ST  depression in inferior leads  Normal imaging portion of stress study, with hyperdynamic  response of all segments with cavity obliteration. [facility] treadmill  score of 4 supports low to intermediate risk of major cardiac  events.   Assessment and Plan:  1.  Microvascular angina, clinically stable without recurring symptoms on medical therapy.  ECG is normal today.  Continue aspirin, Norvasc, Ranexa, and Crestor.  2.  Mixed hyperlipidemia, tolerating Crestor with follow-up by PCP.  3.  Essential hypertension, systolic blood pressure 854 today.  She is also on Zestoretic.  Last creatinine 0.93 with normal potassium.  Medication Adjustments/Labs and Tests Ordered: Current medicines are reviewed at length with the patient today.  Concerns regarding medicines are outlined above.   Tests Ordered: Orders  Placed This Encounter  Procedures  . EKG 12-Lead    Medication Changes: Meds ordered this encounter  Medications  . ranolazine (RANEXA) 500 MG 12 hr tablet    Sig: Take 1 tablet (500 mg total) by mouth 2 (two) times daily.    Dispense:  90 tablet    Refill:  3    Disposition:  Follow up 1 year.  Signed, Satira Sark, MD, [facility] 03/22/2021 9:34 AM    [facility] [place]. [address] [place], [place] [address] Phone: [phone]; Fax: [phone]

## 2021-03-22 ENCOUNTER — Encounter: Payer: Self-pay | Admitting: Cardiology

## 2021-03-22 ENCOUNTER — Ambulatory Visit: Payer: BC Managed Care – PPO | Admitting: Cardiology

## 2021-03-22 ENCOUNTER — Other Ambulatory Visit: Payer: Self-pay

## 2021-03-22 VITALS — BP 122/60 | HR 66 | Wt 162.5 lb

## 2021-03-22 DIAGNOSIS — E782 Mixed hyperlipidemia: Secondary | ICD-10-CM | POA: Diagnosis not present

## 2021-03-22 DIAGNOSIS — I208 Other forms of angina pectoris: Secondary | ICD-10-CM

## 2021-03-22 DIAGNOSIS — I10 Essential (primary) hypertension: Secondary | ICD-10-CM

## 2021-03-22 MED ORDER — RANOLAZINE ER 500 MG PO TB12: 500.0000 mg | ORAL_TABLET | Freq: Two times a day (BID) | ORAL | 3 refills | Status: DC

## 2021-03-22 NOTE — Patient Instructions (Signed)
Medication Instructions:  Your physician recommends that you continue on your current medications as directed. Please refer to the Current Medication list given to you today.   I refilled your Ranexa  *If you need a refill on your cardiac medications before your next appointment, please call your pharmacy*   Lab Work: None  If you have labs (blood work) drawn today and your tests are completely normal, you will receive your results only by: Marland Kitchen MyChart Message (if you have MyChart) OR . A paper copy in the mail If you have any lab test that is abnormal or we need to change your treatment, we will call you to review the results.   Testing/Procedures: None    Follow-Up: At [facility], you and your health needs are our priority.  As part of our continuing mission to provide you with exceptional heart care, we have created designated Provider Care Teams.  These Care Teams include your primary Cardiologist (physician) and Advanced Practice Providers (APPs -  Physician Assistants and Nurse Practitioners) who all work together to provide you with the care you need, when you need it.  We recommend signing up for the patient portal called "MyChart".  Sign up information is provided on this After Visit Summary.  MyChart is used to connect with patients for Virtual Visits (Telemedicine).  Patients are able to view lab/test results, encounter notes, upcoming appointments, etc.  Non-urgent messages can be sent to your provider as well.   To learn more about what you can do with MyChart, go to [email or web address].    Your next appointment:   12 month(s)  The format for your next appointment:   In Person  Provider:   Rozann Lesches, MD   Other Instructions None

## 2021-03-26 ENCOUNTER — Other Ambulatory Visit: Payer: Self-pay

## 2021-03-26 ENCOUNTER — Telehealth: Payer: Self-pay | Admitting: *Deleted

## 2021-03-26 ENCOUNTER — Telehealth (INDEPENDENT_AMBULATORY_CARE_PROVIDER_SITE_OTHER): Payer: BC Managed Care – PPO | Admitting: Family Medicine

## 2021-03-26 DIAGNOSIS — E785 Hyperlipidemia, unspecified: Secondary | ICD-10-CM

## 2021-03-26 DIAGNOSIS — R7303 Prediabetes: Secondary | ICD-10-CM | POA: Diagnosis not present

## 2021-03-26 DIAGNOSIS — I10 Essential (primary) hypertension: Secondary | ICD-10-CM | POA: Diagnosis not present

## 2021-03-26 MED ORDER — LISINOPRIL-HYDROCHLOROTHIAZIDE 20-25 MG PO TABS: 1.0000 | ORAL_TABLET | Freq: Every day | ORAL | 1 refills | Status: DC

## 2021-03-26 MED ORDER — AMLODIPINE BESYLATE 5 MG PO TABS: ORAL_TABLET | ORAL | 1 refills | Status: DC

## 2021-03-26 MED ORDER — ROSUVASTATIN CALCIUM 10 MG PO TABS: ORAL_TABLET | ORAL | 1 refills | Status: DC

## 2021-03-26 NOTE — Progress Notes (Signed)
Patient ID: Daisy Edwards, female    DOB: 20-Dec-1955, 65 y.o.   MRN: [id number]  Virtual Visit via Telephone Note  I connected with Daisy Edwards on 03/26/21 at  9:40 AM EDT by telephone and verified that I am speaking with the correct person using two identifiers.  Location: Patient: home Provider: home   I discussed the limitations, risks, security and privacy concerns of performing an evaluation and management service by telephone and the availability of in person appointments. I also discussed with the patient that there may be a patient responsible charge related to this service. The patient expressed understanding and agreed to proceed.    Chief Complaint  Patient presents with  . Hypertension    Follow up   Subjective:    HPI Pt doing well.  Not needing to take anything for allergies. Seasonal allergies.  HTN Pt compliant with BP meds.  No SEs Denies chest pain, sob, LE swelling, or blurry vision. BP ranges at home   HLD- doing well no new concerns.  Compliant with meds. No chest pain, palpitations, myalgias or joint pains.  Seeing Dr. Domenic Polite, cards.  Taking ranexa.  Doing well.   Medical History Daisy Edwards has a past medical history of Anemia, Anxiety, Arthritis, Back pain, chronic, Carpal tunnel syndrome on both sides, Cervical vertebral fusion, Chronic leg pain, Elevated cholesterol, Essential hypertension, Hyperlipidemia, Impaired fasting glucose, and Microvascular angina ([place]).   Outpatient Encounter Medications as of 03/26/2021  Medication Sig  . amLODipine (NORVASC) 5 MG tablet TAKE 1 TABLET BY MOUTH EVERYDAY AT BEDTIME  . aspirin 81 MG tablet Take 81 mg by mouth daily.  . Cholecalciferol 2000 units CAPS Take 1 daily (Patient taking differently: Take 1 capsule by mouth daily. Take 1 daily)  . clonazePAM (KLONOPIN) 0.5 MG tablet Take 1 tablet (0.5 mg total) by mouth daily as needed for anxiety.  Marland Kitchen lisinopril-hydrochlorothiazide (ZESTORETIC) 20-25 MG tablet  Take 1 tablet by mouth daily.  . Multiple Vitamin (MULITIVITAMIN WITH MINERALS) TABS Take 1 tablet by mouth daily.  . Omega-3 Fatty Acids (FISH OIL TRIPLE STRENGTH PO) Take 1,500 mg by mouth daily.   . ranolazine (RANEXA) 500 MG 12 hr tablet Take 1 tablet (500 mg total) by mouth 2 (two) times daily.  . rosuvastatin (CRESTOR) 10 MG tablet TAKE 1 TABLET BY MOUTH ONCE DAILY FOR CHOLESTEROL  . TURMERIC PO Take by mouth.  . [DISCONTINUED] amLODipine (NORVASC) 5 MG tablet TAKE 1 TABLET BY MOUTH EVERYDAY AT BEDTIME  . [DISCONTINUED] lisinopril-hydrochlorothiazide (ZESTORETIC) 20-25 MG tablet TAKE 1 TABLET BY MOUTH EVERY DAY  . [DISCONTINUED] rosuvastatin (CRESTOR) 10 MG tablet TAKE 1 TABLET BY MOUTH ONCE DAILY FOR CHOLESTEROL   No facility-administered encounter medications on file as of 03/26/2021.     Review of Systems  Constitutional: Negative for chills and fever.  HENT: Negative for congestion, rhinorrhea and sore throat.   Respiratory: Negative for cough, shortness of breath and wheezing.   Cardiovascular: Negative for chest pain and leg swelling.  Gastrointestinal: Negative for abdominal pain, diarrhea, nausea and vomiting.  Genitourinary: Negative for dysuria and frequency.  Musculoskeletal: Negative for arthralgias and back pain.  Skin: Negative for rash.  Neurological: Negative for dizziness, weakness and headaches.     Vitals There were no vitals taken for this visit.  Objective:   Physical Exam  No PE due to phone visit.  Assessment and Plan   1. Essential hypertension, benign - CMP14+EGFR - Lipid panel  2. Prediabetes - CMP14+EGFR  3. Hyperlipidemia, unspecified hyperlipidemia type - Lipid panel   Ordered labs for recheck.  Pt to check bp at her pharmacy and call if seeing numbers over 140/85 or above.  Pt in agreement.  F/u [address] prn.  Return in about 6 months (around 09/26/2021) for f/u htn, hld.  BP Readings from Last 3 Encounters:  03/22/21 122/60   01/29/21 (!) 147/73  10/10/20 132/74    Follow Up Instructions:    I discussed the assessment and treatment plan with the patient. The patient was provided an opportunity to ask questions and all were answered. The patient agreed with the plan and demonstrated an understanding of the instructions.   The patient was advised to call back or seek an in-person evaluation if the symptoms worsen or if the condition fails to improve as anticipated.  I provided 15 minutes of non-face-to-face time during this encounter.   MLincoln DO

## 2021-03-26 NOTE — Telephone Encounter (Signed)
Ms. jayci, ellefson are scheduled for a virtual visit with your provider today.    Just as we do with appointments in the office, we must obtain your consent to participate.  Your consent will be active for this visit and any virtual visit you may have with one of our providers in the next 365 days.    If you have a MyChart account, I can also send a copy of this consent to you electronically.  All virtual visits are billed to your insurance company just like a traditional visit in the office.  As this is a virtual visit, video technology does not allow for your provider to perform a traditional examination.  This may limit your provider's ability to fully assess your condition.  If your provider identifies any concerns that need to be evaluated in person or the need to arrange testing such as labs, EKG, etc, we will make arrangements to do so.    Although advances in technology are sophisticated, we cannot ensure that it will always work on either your end or our end.  If the connection with a video visit is poor, we may have to switch to a telephone visit.  With either a video or telephone visit, we are not always able to ensure that we have a secure connection.   I need to obtain your verbal consent now.   Are you willing to proceed with your visit today?   SIARA GORDER has provided verbal consent on 03/26/2021 for a virtual visit (video or telephone).

## 2021-04-02 DIAGNOSIS — M7981 Nontraumatic hematoma of soft tissue: Secondary | ICD-10-CM | POA: Diagnosis not present

## 2021-04-02 DIAGNOSIS — I8311 Varicose veins of right lower extremity with inflammation: Secondary | ICD-10-CM | POA: Diagnosis not present

## 2021-04-03 DIAGNOSIS — E785 Hyperlipidemia, unspecified: Secondary | ICD-10-CM | POA: Diagnosis not present

## 2021-04-03 DIAGNOSIS — R7303 Prediabetes: Secondary | ICD-10-CM | POA: Diagnosis not present

## 2021-04-03 DIAGNOSIS — I10 Essential (primary) hypertension: Secondary | ICD-10-CM | POA: Diagnosis not present

## 2021-04-04 LAB — CMP14+EGFR
ALT: 22 IU/L (ref 0–32)
AST: 15 IU/L (ref 0–40)
Albumin/Globulin Ratio: 2.1 (ref 1.2–2.2)
Albumin: 4.5 g/dL (ref 3.8–4.8)
Alkaline Phosphatase: 63 IU/L (ref 44–121)
BUN/Creatinine Ratio: 12 (ref 12–28)
BUN: 12 mg/dL (ref 8–27)
Bilirubin Total: 0.4 mg/dL (ref 0.0–1.2)
CO2: 24 mmol/L (ref 20–29)
Calcium: 9.6 mg/dL (ref 8.7–10.3)
Chloride: 103 mmol/L (ref 96–106)
Creatinine, Ser: 1.01 mg/dL — ABNORMAL HIGH (ref 0.57–1.00)
Globulin, Total: 2.1 g/dL (ref 1.5–4.5)
Glucose: 128 mg/dL — ABNORMAL HIGH (ref 65–99)
Potassium: 4.2 mmol/L (ref 3.5–5.2)
Sodium: 141 mmol/L (ref 134–144)
Total Protein: 6.6 g/dL (ref 6.0–8.5)
eGFR: 62 mL/min/{1.73_m2} (ref >59)

## 2021-04-04 LAB — LIPID PANEL
Chol/HDL Ratio: 2.9 ratio (ref 0.0–4.4)
Cholesterol, Total: 191 mg/dL (ref 100–199)
HDL: 67 mg/dL (ref >39)
LDL Chol Calc (NIH): 109 mg/dL — ABNORMAL HIGH (ref 0–99)
Triglycerides: 84 mg/dL (ref 0–149)
VLDL Cholesterol Cal: 15 mg/dL (ref 5–40)

## 2021-04-17 DIAGNOSIS — I8311 Varicose veins of right lower extremity with inflammation: Secondary | ICD-10-CM | POA: Diagnosis not present

## 2021-04-18 ENCOUNTER — Other Ambulatory Visit: Payer: Self-pay | Admitting: Family Medicine

## 2021-04-20 ENCOUNTER — Other Ambulatory Visit: Payer: Self-pay | Admitting: Family Medicine

## 2021-05-14 DIAGNOSIS — I8312 Varicose veins of left lower extremity with inflammation: Secondary | ICD-10-CM | POA: Diagnosis not present

## 2021-05-14 DIAGNOSIS — I83812 Varicose veins of left lower extremities with pain: Secondary | ICD-10-CM | POA: Diagnosis not present

## 2021-05-14 DIAGNOSIS — I83892 Varicose veins of left lower extremities with other complications: Secondary | ICD-10-CM | POA: Diagnosis not present

## 2021-05-16 DIAGNOSIS — I8312 Varicose veins of left lower extremity with inflammation: Secondary | ICD-10-CM | POA: Diagnosis not present

## 2021-05-17 DIAGNOSIS — H01001 Unspecified blepharitis right upper eyelid: Secondary | ICD-10-CM | POA: Diagnosis not present

## 2021-05-17 DIAGNOSIS — H25813 Combined forms of age-related cataract, bilateral: Secondary | ICD-10-CM | POA: Diagnosis not present

## 2021-05-17 DIAGNOSIS — H01004 Unspecified blepharitis left upper eyelid: Secondary | ICD-10-CM | POA: Diagnosis not present

## 2021-05-17 DIAGNOSIS — H01002 Unspecified blepharitis right lower eyelid: Secondary | ICD-10-CM | POA: Diagnosis not present

## 2021-06-20 NOTE — H&P (Signed)
Surgical History & Physical  Patient Name: Daisy Edwards                      DOB: [birth year]  Surgery: Cataract extraction with intraocular lens implant phacoemulsification; Right Eye  Surgeon: Baruch Goldmann MD Surgery Date:  06/27/2021 Pre-Op Date:  06/18/2021  HPI: A 56 Yr. old female patient 1. 1. The patient complains of difficulty when reading fine print, books, newspaper, instructions etc., which began many years ago. Both eyes are affected. The episode is gradual. The condition's severity decreased since last visit. The complaint is associated with blurry vision and glare. The patient struggles when reading fine print even when wearing readers. The patient also complains of blurry vision when driving at night due to glare from oncoming traffic and lights. This is negatively affecting the patient's quality of life. The patient experiences no pain or discomfort, but reports a little dryness occasionally. The patient does not use any drops. The patient reports floaters, but no flashes of light, curtains or veils. HPI was performed by Baruch Goldmann .  Medical History: Dry Eyes Cataracts Heart Problem High Blood Pressure  Review of Systems Negative Allergic/Immunologic Negative Cardiovascular Negative Constitutional Negative Ear, Nose, Mouth & Throat Negative Endocrine Negative Eyes Negative Gastrointestinal Negative Genitourinary Negative Hemotologic/Lymphatic Negative Integumentary Negative Musculoskeletal Negative Neurological Negative Psychiatry Negative Respiratory  Social   Former smoker  Medication Ranolazine, Amlodipine, Lisinopril, Rosuvastatin,   Sx/Procedures Hystectomy, Neck, Gallbladder,   Drug Allergies   NKDA  History & Physical: Heent:  Cataract, Right eye NECK: supple without bruits LUNGS: lungs clear to auscultation CV: regular rate and rhythm Abdomen: soft and non-tender  Impression & Plan: Assessment: 1.  COMBINED FORMS AGE RELATED  CATARACT; Both Eyes (H25.813) 2.  BLEPHARITIS; Right Upper Lid, Right Lower Lid, Left Upper Lid, Left Lower Lid (H01.001, H01.002,H01.004,H01.005) 3.  CONJUNCTIVOCHALASIS; Both Eyes (H11.823) 4.  DERMATOCHALASIS; Right Upper Lid, Left Upper Lid (H02.831, [id number])  Plan: 1.  Cataract accounts for the patient's decreased vision. This visual impairment is not correctable with a tolerable change in glasses or contact lenses. Cataract surgery with an implantation of a new lens should significantly improve the visual and functional status of the patient. Discussed all risks, benefits, alternatives, and potential complications. Discussed the procedures and recovery. Patient desires to have surgery. A-scan ordered and performed today for intra-ocular lens calculations. The surgery will be performed in order to improve vision for driving, reading, and for eye examinations. Recommend phacoemulsification with intra-ocular lens. Recommend Dextenza for post-operative pain and inflammation. Right Eye worse - first. Dilates poorly - shugacaine by protocol. Vision [place]. [place]mid[place]. Declines multifocal.  2.  Recommended regular lid cleaning.  3.  Asymptomatic.  4.  Asymptomatic, recommend observation for now. Findings, prognosis and treatment options reviewed.

## 2021-06-22 ENCOUNTER — Encounter (HOSPITAL_COMMUNITY)
Admission: RE | Admit: 2021-06-22 | Discharge: 2021-06-22 | Disposition: A | Discharge status: Home / Self Care | Payer: BC Managed Care – PPO | Source: Ambulatory Visit | Attending: Ophthalmology | Admitting: Ophthalmology

## 2021-06-22 ENCOUNTER — Other Ambulatory Visit: Payer: Self-pay

## 2021-06-25 DIAGNOSIS — I8312 Varicose veins of left lower extremity with inflammation: Secondary | ICD-10-CM | POA: Diagnosis not present

## 2021-06-27 ENCOUNTER — Encounter (HOSPITAL_COMMUNITY): Admission: RE | Payer: Self-pay | Source: Home / Self Care

## 2021-06-27 ENCOUNTER — Ambulatory Visit (HOSPITAL_COMMUNITY): Admission: RE | Admit: 2021-06-27 | Payer: BC Managed Care – PPO | Source: Home / Self Care | Admitting: Ophthalmology

## 2021-06-27 SURGERY — PHACOEMULSIFICATION, CATARACT, WITH IOL INSERTION
Anesthesia: Monitor Anesthesia Care | Laterality: Right

## 2021-07-02 ENCOUNTER — Other Ambulatory Visit (HOSPITAL_COMMUNITY): Payer: BC Managed Care – PPO

## 2021-07-16 DIAGNOSIS — I83812 Varicose veins of left lower extremities with pain: Secondary | ICD-10-CM | POA: Diagnosis not present

## 2021-07-16 DIAGNOSIS — I8312 Varicose veins of left lower extremity with inflammation: Secondary | ICD-10-CM | POA: Diagnosis not present

## 2021-07-16 DIAGNOSIS — I83892 Varicose veins of left lower extremities with other complications: Secondary | ICD-10-CM | POA: Diagnosis not present

## 2021-07-30 DIAGNOSIS — I8312 Varicose veins of left lower extremity with inflammation: Secondary | ICD-10-CM | POA: Diagnosis not present

## 2021-08-13 DIAGNOSIS — I8312 Varicose veins of left lower extremity with inflammation: Secondary | ICD-10-CM | POA: Diagnosis not present

## 2021-08-13 DIAGNOSIS — M7981 Nontraumatic hematoma of soft tissue: Secondary | ICD-10-CM | POA: Diagnosis not present

## 2021-10-03 DIAGNOSIS — H25813 Combined forms of age-related cataract, bilateral: Secondary | ICD-10-CM | POA: Diagnosis not present

## 2021-10-03 DIAGNOSIS — H2189 Other specified disorders of iris and ciliary body: Secondary | ICD-10-CM | POA: Diagnosis not present

## 2021-10-03 DIAGNOSIS — H35033 Hypertensive retinopathy, bilateral: Secondary | ICD-10-CM | POA: Diagnosis not present

## 2021-10-08 ENCOUNTER — Other Ambulatory Visit: Payer: Self-pay | Admitting: Family Medicine

## 2021-10-08 ENCOUNTER — Other Ambulatory Visit: Payer: Self-pay | Admitting: Cardiology

## 2021-10-22 ENCOUNTER — Ambulatory Visit: Payer: BC Managed Care – PPO | Admitting: Nurse Practitioner

## 2021-11-05 ENCOUNTER — Ambulatory Visit (INDEPENDENT_AMBULATORY_CARE_PROVIDER_SITE_OTHER): Payer: BC Managed Care – PPO | Admitting: Nurse Practitioner

## 2021-11-05 ENCOUNTER — Other Ambulatory Visit: Payer: Self-pay

## 2021-11-05 ENCOUNTER — Encounter: Payer: Self-pay | Admitting: Nurse Practitioner

## 2021-11-05 VITALS — BP 136/81 | HR 63 | Temp 98.1°F | Ht 62.5 in | Wt 159.2 lb

## 2021-11-05 DIAGNOSIS — I10 Essential (primary) hypertension: Secondary | ICD-10-CM

## 2021-11-05 DIAGNOSIS — R7303 Prediabetes: Secondary | ICD-10-CM | POA: Diagnosis not present

## 2021-11-05 DIAGNOSIS — E785 Hyperlipidemia, unspecified: Secondary | ICD-10-CM | POA: Diagnosis not present

## 2021-11-05 MED ORDER — AMLODIPINE BESYLATE 5 MG PO TABS: ORAL_TABLET | ORAL | 1 refills | Status: DC

## 2021-11-05 NOTE — Progress Notes (Addendum)
° °  Subjective:    Patient ID: Daisy Edwards, female    DOB: 12/26/1955, 65 y.o.   MRN: [id number]  HPI Patient here for BP check and refill on Norvasc.  BP today 136/81. Patient states that she takes her BP at home regularly and that her BP runs around 130/80s. Patient has no complaints.  Patient denies any palpitations, chest pain, changes to her vision, or weakness.    Review of Systems  Constitutional: Negative.   HENT: Negative.    Eyes: Negative.   Respiratory: Negative.    Cardiovascular: Negative.   Gastrointestinal: Negative.   Endocrine: Negative.   Genitourinary: Negative.   Musculoskeletal: Negative.   Skin: Negative.   Allergic/Immunologic: Negative.   Neurological: Negative.   Hematological: Negative.   Psychiatric/Behavioral: Negative.        Objective:   Physical Exam Constitutional:      General: She is not in acute distress.    Appearance: Normal appearance. She is normal weight. She is not ill-appearing or toxic-appearing.  Cardiovascular:     Rate and Rhythm: Normal rate and regular rhythm.     Pulses: Normal pulses.     Heart sounds: Normal heart sounds. No murmur heard. Pulmonary:     Effort: Pulmonary effort is normal. No respiratory distress.     Breath sounds: Normal breath sounds. No wheezing.  Musculoskeletal:        General: Normal range of motion.     Cervical back: Normal range of motion.  Skin:    General: Skin is warm and dry.  Neurological:     General: No focal deficit present.     Mental Status: She is alert and oriented to person, place, and time.  Psychiatric:        Mood and Affect: Mood normal.        Behavior: Behavior normal.          Assessment & Plan:  1. Essential hypertension, benign - BP today 136/81. GOAL 130/80 - Norvasc refilled - CMP14+EGFR - F/u in 6 months or sooner if needed  2. Prediabetes - HgB A1c  3. Hyperlipidemia, unspecified hyperlipidemia type - Lipid Profile  ** Note done by Mariane Baumgarten, FNP

## 2021-11-06 ENCOUNTER — Other Ambulatory Visit (INDEPENDENT_AMBULATORY_CARE_PROVIDER_SITE_OTHER): Payer: BC Managed Care – PPO | Admitting: Nurse Practitioner

## 2021-11-06 LAB — CMP14+EGFR
ALT: 18 IU/L (ref 0–32)
AST: 12 IU/L (ref 0–40)
Albumin/Globulin Ratio: 2.1 (ref 1.2–2.2)
Albumin: 4.6 g/dL (ref 3.8–4.8)
Alkaline Phosphatase: 60 IU/L (ref 44–121)
BUN/Creatinine Ratio: 20 (ref 12–28)
BUN: 17 mg/dL (ref 8–27)
Bilirubin Total: 0.5 mg/dL (ref 0.0–1.2)
CO2: 25 mmol/L (ref 20–29)
Calcium: 9.8 mg/dL (ref 8.7–10.3)
Chloride: 101 mmol/L (ref 96–106)
Creatinine, Ser: 0.85 mg/dL (ref 0.57–1.00)
Globulin, Total: 2.2 g/dL (ref 1.5–4.5)
Glucose: 115 mg/dL — ABNORMAL HIGH (ref 70–99)
Potassium: 4.3 mmol/L (ref 3.5–5.2)
Sodium: 138 mmol/L (ref 134–144)
Total Protein: 6.8 g/dL (ref 6.0–8.5)
eGFR: 76 mL/min/{1.73_m2} (ref >59)

## 2021-11-06 LAB — LIPID PANEL
Chol/HDL Ratio: 2.7 ratio (ref 0.0–4.4)
Cholesterol, Total: 205 mg/dL — ABNORMAL HIGH (ref 100–199)
HDL: 75 mg/dL (ref >39)
LDL Chol Calc (NIH): 119 mg/dL — ABNORMAL HIGH (ref 0–99)
Triglycerides: 62 mg/dL (ref 0–149)
VLDL Cholesterol Cal: 11 mg/dL (ref 5–40)

## 2021-11-06 LAB — HEMOGLOBIN A1C
Est. average glucose Bld gHb Est-mCnc: 120 mg/dL
Hgb A1c MFr Bld: 5.8 % — ABNORMAL HIGH (ref 4.8–5.6)

## 2021-11-06 MED ORDER — ROSUVASTATIN CALCIUM 20 MG PO TABS: 20.0000 mg | ORAL_TABLET | Freq: Every day | ORAL | 0 refills | Status: DC

## 2021-11-06 NOTE — Progress Notes (Signed)
Reviewed patient's Lipid panel.   10 year ASCVD risk is 9.3%. High intensity statin recommended especially given patient's cardiac hx. Patient to be informed to increase statin dose from 10mg  of Crestor daily to 20mg  of Crestor daily.   Will recheck lipids at next visit.

## 2021-11-06 NOTE — Progress Notes (Addendum)
Please call patient with the following message.  Your labs were reviewed. LDL ("bad" cholesterol) is up from 7 months ago. Recommend that you take (2) 10mg  pills of Crestor daily instead of (1). Increasing statin dosage will potentially decrease your risk of cardiac disease and stroke. Schedule to return to the clinic for lab work in 12 weeks to monitor how you are doing with the new dose.  Your other labs looked great! Even your creatine (kidney) lab looked better than last time.  Your A1C is 5.8 which is still in prediabetic range. Continue to eat well balanced meals with lean meats and lots of vegetables. Exercise at least 30 minutes for 3-5x a day.   See you soon.

## 2021-11-07 NOTE — Progress Notes (Deleted)
Please notify patient of the last message and also, please have patient schedule back to see me in 12 weeks so that I can re-check blood work.

## 2021-12-10 DIAGNOSIS — H25812 Combined forms of age-related cataract, left eye: Secondary | ICD-10-CM | POA: Diagnosis not present

## 2021-12-25 DIAGNOSIS — H25812 Combined forms of age-related cataract, left eye: Secondary | ICD-10-CM | POA: Diagnosis not present

## 2021-12-28 ENCOUNTER — Other Ambulatory Visit: Payer: Self-pay | Admitting: Family Medicine

## 2021-12-28 ENCOUNTER — Other Ambulatory Visit: Payer: Self-pay | Admitting: Nurse Practitioner

## 2021-12-28 DIAGNOSIS — I10 Essential (primary) hypertension: Secondary | ICD-10-CM

## 2021-12-28 DIAGNOSIS — R7303 Prediabetes: Secondary | ICD-10-CM

## 2021-12-28 DIAGNOSIS — E785 Hyperlipidemia, unspecified: Secondary | ICD-10-CM

## 2021-12-28 NOTE — Telephone Encounter (Signed)
Patient informed that medications sent in and needs to schedule appointment with Barbee Shropshire. Labs have been ordered and can go prior to appointment.  Verbalized understanding.

## 2021-12-28 NOTE — Telephone Encounter (Signed)
LMTRC

## 2021-12-31 DIAGNOSIS — H25811 Combined forms of age-related cataract, right eye: Secondary | ICD-10-CM | POA: Diagnosis not present

## 2022-01-01 DIAGNOSIS — E785 Hyperlipidemia, unspecified: Secondary | ICD-10-CM | POA: Diagnosis not present

## 2022-01-01 DIAGNOSIS — R7303 Prediabetes: Secondary | ICD-10-CM | POA: Diagnosis not present

## 2022-01-01 DIAGNOSIS — I10 Essential (primary) hypertension: Secondary | ICD-10-CM | POA: Diagnosis not present

## 2022-01-02 LAB — CMP14+EGFR
ALT: 18 IU/L (ref 0–32)
AST: 14 IU/L (ref 0–40)
Albumin/Globulin Ratio: 2.2 (ref 1.2–2.2)
Albumin: 4.6 g/dL (ref 3.8–4.8)
Alkaline Phosphatase: 65 IU/L (ref 44–121)
BUN/Creatinine Ratio: 16 (ref 12–28)
BUN: 14 mg/dL (ref 8–27)
Bilirubin Total: 0.5 mg/dL (ref 0.0–1.2)
CO2: 27 mmol/L (ref 20–29)
Calcium: 9.4 mg/dL (ref 8.7–10.3)
Chloride: 102 mmol/L (ref 96–106)
Creatinine, Ser: 0.89 mg/dL (ref 0.57–1.00)
Globulin, Total: 2.1 g/dL (ref 1.5–4.5)
Glucose: 120 mg/dL — ABNORMAL HIGH (ref 70–99)
Potassium: 4.2 mmol/L (ref 3.5–5.2)
Sodium: 141 mmol/L (ref 134–144)
Total Protein: 6.7 g/dL (ref 6.0–8.5)
eGFR: 72 mL/min/{1.73_m2} (ref >59)

## 2022-01-02 LAB — HEMOGLOBIN A1C
Est. average glucose Bld gHb Est-mCnc: 117 mg/dL
Hgb A1c MFr Bld: 5.7 % — ABNORMAL HIGH (ref 4.8–5.6)

## 2022-01-02 LAB — LIPID PANEL WITH LDL/HDL RATIO
Cholesterol, Total: 196 mg/dL (ref 100–199)
HDL: 79 mg/dL (ref >39)
LDL Chol Calc (NIH): 102 mg/dL — ABNORMAL HIGH (ref 0–99)
LDL/HDL Ratio: 1.3 ratio (ref 0.0–3.2)
Triglycerides: 81 mg/dL (ref 0–149)
VLDL Cholesterol Cal: 15 mg/dL (ref 5–40)

## 2022-01-03 ENCOUNTER — Ambulatory Visit: Payer: BC Managed Care – PPO | Admitting: Nurse Practitioner

## 2022-01-09 ENCOUNTER — Encounter: Payer: Self-pay | Admitting: Nurse Practitioner

## 2022-01-09 ENCOUNTER — Other Ambulatory Visit: Payer: Self-pay

## 2022-01-09 ENCOUNTER — Ambulatory Visit (INDEPENDENT_AMBULATORY_CARE_PROVIDER_SITE_OTHER): Payer: BC Managed Care – PPO | Admitting: Nurse Practitioner

## 2022-01-09 VITALS — BP 133/84 | HR 69 | Temp 97.4°F | Ht 62.5 in | Wt 162.6 lb

## 2022-01-09 DIAGNOSIS — R7303 Prediabetes: Secondary | ICD-10-CM | POA: Diagnosis not present

## 2022-01-09 DIAGNOSIS — E785 Hyperlipidemia, unspecified: Secondary | ICD-10-CM

## 2022-01-09 DIAGNOSIS — I10 Essential (primary) hypertension: Secondary | ICD-10-CM

## 2022-01-09 DIAGNOSIS — L989 Disorder of the skin and subcutaneous tissue, unspecified: Secondary | ICD-10-CM | POA: Diagnosis not present

## 2022-01-09 MED ORDER — TRIAMCINOLONE ACETONIDE 0.1 % EX CREA: 1.0000 "application " | TOPICAL_CREAM | Freq: Every day | CUTANEOUS | 0 refills | Status: DC

## 2022-01-09 MED ORDER — LISINOPRIL-HYDROCHLOROTHIAZIDE 20-25 MG PO TABS: 1.0000 | ORAL_TABLET | Freq: Every day | ORAL | 1 refills | Status: DC

## 2022-01-09 MED ORDER — ROSUVASTATIN CALCIUM 20 MG PO TABS: 20.0000 mg | ORAL_TABLET | Freq: Every day | ORAL | 1 refills | Status: DC

## 2022-01-09 NOTE — Progress Notes (Addendum)
Patient  Subjective:    Patient ID: Daisy Edwards, female    DOB: 1956/05/29, 66 y.o.   MRN: [id number]  HPI  Patient here for follow up on elevated cholesterol and blood pressure.  Patient's Crestor was increased to 20 mg during last visit.  Patient states that she has been tolerating increased statin without difficulty.  Patient denies any chest pain, shortness of breath, difficulty breathing, new onset headaches.  Labs reviewed with patient.  Patient also has a lesion on left side of face she would liked to be checked.  Patient states that she noticed area to the left side of her face approximately 30 days ago.  Patient was not concerned about it until it started itching a couple days ago.  Patient denies any changes to color, abnormal shape, discharge or bleeding.  Patient states that she continues to take Klonopin as needed.  Patient states that her last prescription of Klonopin was during the [place] pandemic.  Patient states that she has maybe used 10 pills in total.  Patient does admit that she does experience some anxiety due to world events   Review of Systems  Skin:        Small bump to left cheek  All other systems reviewed and are negative.     Objective:   Physical Exam Constitutional:      General: She is not in acute distress.    Appearance: Normal appearance. She is normal weight. She is not ill-appearing or toxic-appearing.  Cardiovascular:     Rate and Rhythm: Normal rate and regular rhythm.     Pulses: Normal pulses.     Heart sounds: Normal heart sounds. No murmur heard. Pulmonary:     Effort: Pulmonary effort is normal. No respiratory distress.     Breath sounds: Normal breath sounds. No wheezing.  Skin:    General: Skin is warm.     Capillary Refill: Capillary refill takes less than 2 seconds.     Findings: Lesion present.     Comments: Small flesh-colored papule noted to left cheek measuring less than 2 mm.  Area well-circumscribed and symmetric.    Neurological:     General: No focal deficit present.     Mental Status: She is alert and oriented to person, place, and time.  Psychiatric:        Mood and Affect: Mood normal.        Behavior: Behavior normal.        Assessment & Plan:   1. Skin lesion of face -Possible acne versus nevi. -Area does not look concerning to me at this time however through shared decision making patient would like a referral to dermatology for evaluation of the area - Ambulatory referral to Dermatology - triamcinolone cream (KENALOG) 0.1 %; Apply 1 application topically daily.  Dispense: 15 g; Refill: 0 -Patient may use small amount of triamcinolone cream to area on face for short period of time.  Discussed risk of using triamcinolone long-term on face. -Monitor area for increased growth, changes to color, weeping or discharge -Return to clinic if area not better or worse  2. Essential hypertension, benign -Blood pressure today 133/84.  Goal of 140/90 met. -Continue to take amlodipine and Zestoretic as prescribed. - lisinopril-hydrochlorothiazide (ZESTORETIC) 20-25 MG tablet; Take 1 tablet by mouth daily.  Dispense: 90 tablet; Refill: 1 -May call clinic for refills of amlodipine as needed -Return to clinic in 6 months for blood pressure check and labs.  3. Hyperlipidemia, unspecified hyperlipidemia  type -LDL 102.  Goal of LDL less than 100.  Patient is really close to goal. -Continue taking Crestor 20 mg as prescribed.  Also continues to eat well-balanced diet and exercise. - rosuvastatin (CRESTOR) 20 MG tablet; Take 1 tablet (20 mg total) by mouth daily.  Dispense: 90 tablet; Refill: 1 -Follow-up in 6 months for labs.  4.  Prediabetes -A1c 5.7. -No medications needed.  Continue to eat well-balanced diet and exercise.    Note:  This document was prepared using Dragon voice recognition software and may include unintentional dictation errors.

## 2022-01-22 DIAGNOSIS — H25811 Combined forms of age-related cataract, right eye: Secondary | ICD-10-CM | POA: Diagnosis not present

## 2022-02-25 ENCOUNTER — Other Ambulatory Visit (HOSPITAL_COMMUNITY): Payer: Self-pay | Admitting: Nurse Practitioner

## 2022-02-25 DIAGNOSIS — Z1231 Encounter for screening mammogram for malignant neoplasm of breast: Secondary | ICD-10-CM

## 2022-03-04 ENCOUNTER — Ambulatory Visit (HOSPITAL_COMMUNITY): Payer: BC Managed Care – PPO

## 2022-03-25 ENCOUNTER — Ambulatory Visit (HOSPITAL_COMMUNITY): Payer: BC Managed Care – PPO

## 2022-03-25 ENCOUNTER — Other Ambulatory Visit (HOSPITAL_COMMUNITY): Payer: BC Managed Care – PPO

## 2022-04-01 ENCOUNTER — Ambulatory Visit (HOSPITAL_COMMUNITY)
Admission: RE | Admit: 2022-04-01 | Discharge: 2022-04-01 | Disposition: A | Discharge status: Home / Self Care | Payer: BC Managed Care – PPO | Source: Ambulatory Visit | Attending: Nurse Practitioner | Admitting: Nurse Practitioner

## 2022-04-01 ENCOUNTER — Other Ambulatory Visit (HOSPITAL_COMMUNITY): Payer: Self-pay | Admitting: Nurse Practitioner

## 2022-04-01 DIAGNOSIS — R928 Other abnormal and inconclusive findings on diagnostic imaging of breast: Secondary | ICD-10-CM

## 2022-04-01 DIAGNOSIS — Z13820 Encounter for screening for osteoporosis: Secondary | ICD-10-CM | POA: Insufficient documentation

## 2022-04-01 DIAGNOSIS — Z780 Asymptomatic menopausal state: Secondary | ICD-10-CM | POA: Diagnosis not present

## 2022-04-01 DIAGNOSIS — Z1231 Encounter for screening mammogram for malignant neoplasm of breast: Secondary | ICD-10-CM | POA: Diagnosis not present

## 2022-04-04 ENCOUNTER — Ambulatory Visit (HOSPITAL_COMMUNITY)
Admission: RE | Admit: 2022-04-04 | Discharge: 2022-04-04 | Disposition: A | Discharge status: Home / Self Care | Payer: BC Managed Care – PPO | Source: Ambulatory Visit | Attending: Nurse Practitioner | Admitting: Nurse Practitioner

## 2022-04-04 ENCOUNTER — Encounter (HOSPITAL_COMMUNITY): Payer: Self-pay

## 2022-04-04 DIAGNOSIS — R928 Other abnormal and inconclusive findings on diagnostic imaging of breast: Secondary | ICD-10-CM

## 2022-04-05 ENCOUNTER — Telehealth: Payer: Self-pay | Admitting: Nurse Practitioner

## 2022-04-05 NOTE — Telephone Encounter (Signed)
Pt is wondering if she needs a blood test to check to see if she has had measles/chicken pox in the past before getting Shingles vaccine. Please advise. Thank you

## 2022-04-08 NOTE — Telephone Encounter (Signed)
Pt returned call and verbalized understanding  

## 2022-04-08 NOTE — Telephone Encounter (Signed)
Left message to return call 

## 2022-05-27 ENCOUNTER — Telehealth: Payer: Self-pay | Admitting: Nurse Practitioner

## 2022-05-27 ENCOUNTER — Other Ambulatory Visit: Payer: Self-pay | Admitting: Nurse Practitioner

## 2022-05-27 NOTE — Telephone Encounter (Signed)
Patient has physical on 8/14 and needing labs done

## 2022-05-29 ENCOUNTER — Other Ambulatory Visit: Payer: Self-pay | Admitting: Nurse Practitioner

## 2022-05-29 DIAGNOSIS — E785 Hyperlipidemia, unspecified: Secondary | ICD-10-CM

## 2022-05-29 DIAGNOSIS — R7303 Prediabetes: Secondary | ICD-10-CM

## 2022-05-29 DIAGNOSIS — I10 Essential (primary) hypertension: Secondary | ICD-10-CM

## 2022-05-29 NOTE — Telephone Encounter (Signed)
LMTRC

## 2022-05-30 NOTE — Telephone Encounter (Signed)
LM on vm of labs that have been ordered and to go 2-3 days before upcoming appt on 07/01/22. Any questions, to call office back.

## 2022-06-24 DIAGNOSIS — I10 Essential (primary) hypertension: Secondary | ICD-10-CM | POA: Diagnosis not present

## 2022-06-24 DIAGNOSIS — E785 Hyperlipidemia, unspecified: Secondary | ICD-10-CM | POA: Diagnosis not present

## 2022-06-24 DIAGNOSIS — R7303 Prediabetes: Secondary | ICD-10-CM | POA: Diagnosis not present

## 2022-06-25 LAB — CMP14+EGFR
ALT: 57 IU/L — ABNORMAL HIGH (ref 0–32)
AST: 33 IU/L (ref 0–40)
Albumin/Globulin Ratio: 2 (ref 1.2–2.2)
Albumin: 4.6 g/dL (ref 3.9–4.9)
Alkaline Phosphatase: 67 IU/L (ref 44–121)
BUN/Creatinine Ratio: 14 (ref 12–28)
BUN: 13 mg/dL (ref 8–27)
Bilirubin Total: 0.5 mg/dL (ref 0.0–1.2)
CO2: 24 mmol/L (ref 20–29)
Calcium: 9.6 mg/dL (ref 8.7–10.3)
Chloride: 102 mmol/L (ref 96–106)
Creatinine, Ser: 0.91 mg/dL (ref 0.57–1.00)
Globulin, Total: 2.3 g/dL (ref 1.5–4.5)
Glucose: 139 mg/dL — ABNORMAL HIGH (ref 70–99)
Potassium: 4.2 mmol/L (ref 3.5–5.2)
Sodium: 141 mmol/L (ref 134–144)
Total Protein: 6.9 g/dL (ref 6.0–8.5)
eGFR: 70 mL/min/{1.73_m2} (ref >59)

## 2022-06-25 LAB — LIPID PANEL
Chol/HDL Ratio: 2.3 ratio (ref 0.0–4.4)
Cholesterol, Total: 180 mg/dL (ref 100–199)
HDL: 78 mg/dL (ref >39)
LDL Chol Calc (NIH): 87 mg/dL (ref 0–99)
Triglycerides: 79 mg/dL (ref 0–149)
VLDL Cholesterol Cal: 15 mg/dL (ref 5–40)

## 2022-06-25 LAB — MICROALBUMIN / CREATININE URINE RATIO
Creatinine, Urine: 212.4 mg/dL
Microalb/Creat Ratio: 6 mg/g creat (ref 0–29)
Microalbumin, Urine: 12.5 ug/mL

## 2022-06-25 LAB — HEMOGLOBIN A1C
Est. average glucose Bld gHb Est-mCnc: 123 mg/dL
Hgb A1c MFr Bld: 5.9 % — ABNORMAL HIGH (ref 4.8–5.6)

## 2022-07-01 ENCOUNTER — Ambulatory Visit (INDEPENDENT_AMBULATORY_CARE_PROVIDER_SITE_OTHER): Payer: BC Managed Care – PPO | Admitting: Nurse Practitioner

## 2022-07-01 ENCOUNTER — Encounter: Payer: Self-pay | Admitting: Nurse Practitioner

## 2022-07-01 VITALS — BP 128/74 | HR 60 | Temp 97.9°F | Ht 62.25 in | Wt 161.6 lb

## 2022-07-01 DIAGNOSIS — E785 Hyperlipidemia, unspecified: Secondary | ICD-10-CM

## 2022-07-01 DIAGNOSIS — D225 Melanocytic nevi of trunk: Secondary | ICD-10-CM | POA: Insufficient documentation

## 2022-07-01 DIAGNOSIS — R7303 Prediabetes: Secondary | ICD-10-CM

## 2022-07-01 DIAGNOSIS — Z114 Encounter for screening for human immunodeficiency virus [HIV]: Secondary | ICD-10-CM

## 2022-07-01 DIAGNOSIS — I10 Essential (primary) hypertension: Secondary | ICD-10-CM | POA: Diagnosis not present

## 2022-07-01 DIAGNOSIS — Z0000 Encounter for general adult medical examination without abnormal findings: Secondary | ICD-10-CM | POA: Diagnosis not present

## 2022-07-01 DIAGNOSIS — R748 Abnormal levels of other serum enzymes: Secondary | ICD-10-CM

## 2022-07-01 DIAGNOSIS — Z23 Encounter for immunization: Secondary | ICD-10-CM

## 2022-07-01 DIAGNOSIS — Z1159 Encounter for screening for other viral diseases: Secondary | ICD-10-CM

## 2022-07-01 DIAGNOSIS — L821 Other seborrheic keratosis: Secondary | ICD-10-CM | POA: Insufficient documentation

## 2022-07-01 NOTE — Progress Notes (Signed)
Subjective:    Patient ID: Daisy Edwards, female    DOB: [birth year], 66 y.o.   MRN: [id number]  HPI The patient comes in today for a wellness visit.    A review of their health history was completed.  A review of medications was also completed.  Any needed refills; none at this time   Eating habits: good  Falls/  MVA accidents in past few months: none  Regular exercise: walk often   Specialist pt sees on regular basis: sees [facility]   Preventative health issues were discussed.   Additional concerns: bending over/turning around sometimes causes lightheadedness. Patient states that she feels this way when she is tired and symptoms are better with rest.    Review of Systems  Neurological:  Positive for light-headedness.  All other systems reviewed and are negative.      Objective:   Physical Exam Vitals reviewed.  Constitutional:      General: She is not in acute distress.    Appearance: Normal appearance. She is normal weight. She is not ill-appearing, toxic-appearing or diaphoretic.  HENT:     Head: Normocephalic and atraumatic.  Cardiovascular:     Rate and Rhythm: Normal rate and regular rhythm.     Pulses: Normal pulses.     Heart sounds: Normal heart sounds. No murmur heard. Pulmonary:     Effort: Pulmonary effort is normal. No respiratory distress.     Breath sounds: Normal breath sounds. No wheezing.  Musculoskeletal:     Comments: Grossly intact  Skin:    General: Skin is warm.     Capillary Refill: Capillary refill takes less than 2 seconds.  Neurological:     Mental Status: She is alert.     Comments: Grossly intact  Psychiatric:        Mood and Affect: Mood normal.        Behavior: Behavior normal.           Assessment & Plan:   1. Wellness examination Adult wellness-complete.wellness physical was conducted today. Importance of diet and exercise were discussed in detail.  Importance of stress reduction and healthy living were  discussed.  In addition to this a discussion regarding safety was also covered.  We also reviewed over immunizations and gave recommendations regarding current immunization needed for age.   In addition to this additional areas were also touched on including: Preventative health exams needed:  Colonoscopy up to date PAP up to date Mammogram up to date Tetanus up to date Dexa Scan up to date  Patient was advised yearly wellness exam   2. Immunization due -Patient had questions about he 4th COVID vaccine and pneumonia vaccine - Recommend the pneumonia vaccine - Due to mild illness profile of current COVID strand, COVID vaccine up to patient's discretion. However, if a surge occurs recommend vaccination.  3. Prediabetes Last A1c 5.9 - Discuss benefits and side effect profile of Metformin. - Will recheck A1c and made decision regarding metformin at next visit in 3 months. - Discussed benefits of weight training to help with BS control.  - Hemoglobin A1c - RTC in 3 months  4. Essential hypertension, benign - BP intially 150/83 and 128/74 on recheck. Goal of less than 140/90 met.  - CMP14+EGFR - RTC in 3 months  5. Hyperlipidemia, unspecified hyperlipidemia type - Last LDL 87. Within goal of less than 100 - Continue with Crestor $RemoveBefo'20mg'hLndUSUMehw$  - Lipid panel - RTC in 3 months  6. Elevated liver enzymes -  last ALT 57 - Will reheck in 3 months - CMP14+EGFR  7. Encounter for hepatitis C screening test for low risk patient - Hepatitis C Antibody  8. Encounter for screening for HIV - HIV antibody (with reflex)  9. Light headedness - Get plenty rest. - If symptoms persist RTC to be evaluated.     Note:  This document was prepared using Dragon voice recognition software and may include unintentional dictation errors. Note - This record has been created using [employer].  Chart creation errors have been sought, but may not always  have been located. Such creation errors do not  reflect on  the standard of medical care.

## 2022-07-04 ENCOUNTER — Other Ambulatory Visit: Payer: Self-pay | Admitting: *Deleted

## 2022-07-04 NOTE — Patient Outreach (Signed)
  Care Coordination   07/04/2022 Name: Daisy Edwards MRN: [id number] DOB: 04/17/1956   Care Coordination Outreach Attempts:  An unsuccessful telephone outreach was attempted today to offer the patient information about available care coordination services as a benefit of their health plan.   Follow Up Plan:  Additional outreach attempts will be made to offer the patient care coordination information and services.   Encounter Outcome:  No Answer  Care Coordination Interventions Activated:  Yes   Care Coordination Interventions:  No, not indicated    [facility] [phone]

## 2022-07-09 ENCOUNTER — Ambulatory Visit: Payer: Self-pay | Admitting: Nurse Practitioner

## 2022-07-17 ENCOUNTER — Other Ambulatory Visit (INDEPENDENT_AMBULATORY_CARE_PROVIDER_SITE_OTHER): Payer: BC Managed Care – PPO

## 2022-07-17 ENCOUNTER — Encounter: Payer: Self-pay | Admitting: Cardiology

## 2022-07-17 ENCOUNTER — Ambulatory Visit: Payer: BC Managed Care – PPO | Attending: Cardiology | Admitting: Cardiology

## 2022-07-17 VITALS — BP 140/68 | HR 66 | Ht 62.5 in | Wt 161.8 lb

## 2022-07-17 DIAGNOSIS — E782 Mixed hyperlipidemia: Secondary | ICD-10-CM | POA: Diagnosis not present

## 2022-07-17 DIAGNOSIS — I10 Essential (primary) hypertension: Secondary | ICD-10-CM

## 2022-07-17 DIAGNOSIS — I208 Other forms of angina pectoris: Secondary | ICD-10-CM | POA: Diagnosis not present

## 2022-07-17 NOTE — Progress Notes (Signed)
Cardiology Office Note  Date: 07/17/2022   ID: Daisy Edwards, Daisy Edwards 05-20-1956, MRN [id number]  PCP:  Claire Shown, NP  Cardiologist:  Rozann Lesches, MD Electrophysiologist:  None   Chief Complaint  Patient presents with   Cardiac follow-up    History of Present Illness: Daisy Edwards is a 66 y.o. female last seen in May 2022.  She is here for a routine visit.  Reports no progressive angina symptoms on medical therapy, no change in exercise tolerance.  She has had some intermittent episodes of dizziness, not necessarily vertigo but precipitated by sudden changes in position which sound inner ear in etiology rather than cardiac.  I reviewed her medications which are stable from a cardiac perspective and outlined below.  She had recent lab work with PCP, LDL 87.  I personally reviewed her ECG which shows sinus rhythm with decreased R wave progression.  Past Medical History:  Diagnosis Date   Anemia    Anxiety    Panic attack in past   Arthritis    Cervical stenosis, spondylosis    Back pain, chronic    Carpal tunnel syndrome on both sides    Right > left   Cervical vertebral fusion    Chronic leg pain    Elevated cholesterol    Essential hypertension    Hyperlipidemia    Impaired fasting glucose    Microvascular angina [facility])     Past Surgical History:  Procedure Laterality Date   ABDOMINAL HYSTERECTOMY     ANTERIOR CERVICAL DECOMP/DISCECTOMY FUSION  01/29/2012   Procedure: ANTERIOR CERVICAL DECOMPRESSION/DISCECTOMY FUSION 2 LEVELS;  Surgeon: Marybelle Killings, MD;  Location: [place];  Service: Orthopedics;  Laterality: N/A;  C3-4, C4-5 Anterior Cervical Discectomy and Fusion, allograft, plate   CARDIAC CATHETERIZATION     prior to 2008, not sure where it was done, result- [facility]    CHOLECYSTECTOMY     [facility]   COLONOSCOPY     COLONOSCOPY N/A 10/06/2017   Procedure: COLONOSCOPY;  Surgeon: Danie Binder, MD;  [facility];  Service: Endoscopy;   Laterality: N/A;  8:30   POLYPECTOMY  10/06/2017   Procedure: POLYPECTOMY;  Surgeon: Danie Binder, MD;  [facility];  Service: Endoscopy;;  colon   VEIN SURGERY      Current Outpatient Medications  Medication Sig Dispense Refill   amLODipine (NORVASC) 5 MG tablet TAKE 1 TABLET BY MOUTH ONCE DAILY AT BEDTIME 90 tablet 0   aspirin 81 MG tablet Take 81 mg by mouth every other day.     Cholecalciferol 2000 units CAPS Take 1 daily (Patient taking differently: Take 2,000 Units by mouth daily.) 30 each    clonazePAM (KLONOPIN) 0.5 MG tablet Take 1 tablet (0.5 mg total) by mouth daily as needed for anxiety. 24 tablet 5   lisinopril-hydrochlorothiazide (ZESTORETIC) 20-25 MG tablet Take 1 tablet by mouth daily. 90 tablet 1   Multiple Vitamin (MULITIVITAMIN WITH MINERALS) TABS Take 1 tablet by mouth daily.     Omega-3 Fatty Acids (FISH OIL TRIPLE STRENGTH PO) Take 1,500 mg by mouth daily.      ranolazine (RANEXA) 500 MG 12 hr tablet Take 1 tablet by mouth twice daily 180 tablet 3   rosuvastatin (CRESTOR) 20 MG tablet Take 1 tablet (20 mg total) by mouth daily. 90 tablet 1   TURMERIC PO Take 1 capsule by mouth daily as needed (joint pain).     No current facility-administered medications for this  visit.   Allergies:  Vitamin c   ROS: No palpitations or sudden unexplained syncope.  Physical Exam: VS:  BP (!) 140/68   Pulse 66   Ht 5' 2.5" (1.588 m)   Wt 161 lb 12.8 oz (73.4 kg)   SpO2 97%   BMI 29.12 kg/m , BMI Body mass index is 29.12 kg/m.  Wt Readings from Last 3 Encounters:  07/17/22 161 lb 12.8 oz (73.4 kg)  07/01/22 161 lb 9.6 oz (73.3 kg)  01/09/22 162 lb 9.6 oz (73.8 kg)    General: Patient appears comfortable at rest. HEENT: Conjunctiva and lids normal. Neck: Supple, no elevated JVP or carotid bruits, no thyromegaly. Lungs: Clear to auscultation, nonlabored breathing at rest. Cardiac: Regular rate and rhythm, no S3 or significant systolic murmur, no pericardial  rub. Extremities: No pitting edema.  ECG:  An ECG dated 03/22/2021 was personally reviewed today and demonstrated:  Sinus rhythm.  Recent Labwork: 06/24/2022: ALT 57; AST 33; BUN 13; Creatinine, Ser 0.91; Potassium 4.2; Sodium 141     Component Value Date/Time   CHOL 180 06/24/2022 1045   TRIG 79 06/24/2022 1045   HDL 78 06/24/2022 1045   CHOLHDL 2.3 06/24/2022 1045   CHOLHDL 2.7 01/30/2015 1429   VLDL 15 01/30/2015 1429   LDLCALC 87 06/24/2022 1045    Other Studies Reviewed Today:  Exercise echocardiogram 12/30/2016: Study Conclusions   - Impressions: Abnormal exercise EKG portion of stress study, with    1 mm ST depression in inferior leads    Normal imaging portion of stress study, with hyperdynamic    response of all segments with cavity obliteration. [facility] treadmill    score of 4 supports low to intermediate risk of major cardiac    events.   Impressions:   - Abnormal exercise EKG portion of stress study, with 1 mm ST    depression in inferior leads    Normal imaging portion of stress study, with hyperdynamic    response of all segments with cavity obliteration. [facility] treadmill    score of 4 supports low to intermediate risk of major cardiac    events.   Assessment and Plan:  1.  History of microvascular angina, clinically stable on medical therapy.  I reviewed her ECG.  Continue Norvasc, Ranexa, and Crestor.  2.  Mixed hyperlipidemia, tolerating Crestor with recent LDL 87.  3.  Essential hypertension, also on Zestoretic.  Keep follow-up with PCP, no changes were made today.  Medication Adjustments/Labs and Tests Ordered: Current medicines are reviewed at length with the patient today.  Concerns regarding medicines are outlined above.   Tests Ordered: No orders of the defined types were placed in this encounter.   Medication Changes: No orders of the defined types were placed in this encounter.   Disposition:  Follow up  1 year.  Signed, Satira Sark,  MD, [facility] 07/17/2022 11:14 AM    [place] [facility] [address], [place], [place] [address] Phone: [phone]; Fax: [phone]

## 2022-07-17 NOTE — Patient Instructions (Signed)
Medication Instructions:  Your physician recommends that you continue on your current medications as directed. Please refer to the Current Medication list given to you today.   Labwork: None today  Testing/Procedures: None today  Follow-Up: 1 year  Any Other Special Instructions Will Be Listed Below (If Applicable).  If you need a refill on your cardiac medications before your next appointment, please call your pharmacy.  

## 2022-07-23 ENCOUNTER — Other Ambulatory Visit: Payer: Self-pay | Admitting: Nurse Practitioner

## 2022-07-23 DIAGNOSIS — E785 Hyperlipidemia, unspecified: Secondary | ICD-10-CM

## 2022-07-31 ENCOUNTER — Other Ambulatory Visit: Payer: Self-pay | Admitting: *Deleted

## 2022-07-31 NOTE — Patient Outreach (Signed)
  Care Coordination   07/31/2022  Name: Daisy Edwards MRN: [id number] DOB: Aug 06, 1956   Care Coordination Outreach Attempts:  A second unsuccessful outreach was attempted today to offer the patient with information about available care coordination services as a benefit of their health plan.   HIPAA compliant messages left on voicemail, providing contact information for CSW, encouraging patient to return CSW's call at her earliest convenience.  Follow Up Plan:  Additional outreach attempts will be made to offer the patient care coordination information and services.   Encounter Outcome:  No Answer.   Care Coordination Interventions Activated:  No.    Care Coordination Interventions:  No, not indicated.    Nat Christen, BSW, MSW, LCSW  Licensed Education officer, [facility]  Mailing [place] [address], [place], [place] [address] Physical Address-[address], [place], [place] [address] Toll Free Main # [phone] Fax # [phone] Cell # [phone] [email or web address]

## 2022-08-12 ENCOUNTER — Other Ambulatory Visit: Payer: Self-pay | Admitting: *Deleted

## 2022-08-12 NOTE — Patient Outreach (Signed)
  Care Coordination   08/12/2022  Name: PAITYN BALSAM MRN: [id number] DOB: 12/17/1955   Care Coordination Outreach Attempts:  A third unsuccessful outreach was attempted today to offer the patient with information about available care coordination services as a benefit of their health plan. HIPAA compliant message left on voicemail, providing contact information for CSW, encouraging patient to return CSW's call at her earliest convenience.  Follow Up Plan:  No further outreach attempts will be made at this time. We have been unable to contact the patient to offer or enroll patient in care coordination services.  Encounter Outcome:  No Answer.   Care Coordination Interventions Activated:  No.    Care Coordination Interventions:  No, not indicated.    Nat Christen, BSW, MSW, LCSW  Licensed Education officer, [facility]  Mailing [place] [address], [place], [place] [address] Physical Address-[address] [place], [place] [address] Toll Free Main # [phone] Fax # [phone] Cell # [phone] [email or web address]

## 2022-08-31 ENCOUNTER — Other Ambulatory Visit: Payer: Self-pay | Admitting: Nurse Practitioner

## 2022-09-06 ENCOUNTER — Ambulatory Visit: Payer: BC Managed Care – PPO | Admitting: Family Medicine

## 2022-09-06 ENCOUNTER — Encounter: Payer: Self-pay | Admitting: Family Medicine

## 2022-09-06 DIAGNOSIS — J209 Acute bronchitis, unspecified: Secondary | ICD-10-CM | POA: Insufficient documentation

## 2022-09-06 MED ORDER — BENZONATATE 200 MG PO CAPS: 200.0000 mg | ORAL_CAPSULE | Freq: Three times a day (TID) | ORAL | 0 refills | Status: DC | PRN

## 2022-09-06 MED ORDER — AMOXICILLIN-POT CLAVULANATE 875-125 MG PO TABS: 1.0000 | ORAL_TABLET | Freq: Two times a day (BID) | ORAL | 0 refills | Status: DC

## 2022-09-06 NOTE — Progress Notes (Signed)
Subjective:  Patient ID: Daisy Edwards, female    DOB: 21-Jan-1956  Age: 66 y.o. MRN: [id number]  CC: Chief Complaint  Patient presents with   Cough    Cough and congestion. Had a cold going on about 3 weeks. Feels better today. Covid test negative Saturday    HPI:  66 year old female presents for evaluation of the above.  Reports that approximately 3 weeks ago she developed a cold.  She states that she feels better but is still having persistent cough.  Associated congestion.  No fever.  No shortness of breath.  Cough is troublesome both during the day and at night.  She has had a negative home COVID test.  No relieving factors.  No other associated symptoms.  No other complaints.  Patient Active Problem List   Diagnosis Date Noted   Subacute bronchitis 09/06/2022   Seborrheic keratosis 07/01/2022   Melanocytic nevi of trunk 07/01/2022   Encounter for well woman exam with routine gynecological exam 01/29/2021   Prediabetes 02/14/2018   Well woman exam with routine gynecological exam 07/16/2017   Varicose veins of lower extremities with other complications 13/06/6577   Spider veins of both lower extremities 07/19/2014   Family history of premature CAD 04/05/2013   Hyperlipidemia 03/15/2013   Cervical stenosis of spinal canal 01/29/2012    Class: Diagnosis of   Gastritis 07/12/2009   Essential hypertension, benign 03/20/2009    Social Hx   Social History   Socioeconomic History   Marital status: Married    Spouse name: Not on file   Number of children: 0   Years of education: Not on file   Highest education level: Not on file  Occupational History    Employer: [employer]  Tobacco Use   Smoking status: Former    Years: 0.50    Types: Cigarettes   Smokeless tobacco: Never   Tobacco comments:    quit 30 years ago.   Vaping Use   Vaping Use: Never used  Substance and Sexual Activity   Alcohol use: Yes    Alcohol/week: 4.0 standard drinks of alcohol     Types: 4 Glasses of wine per week    Comment: wine, socially    Drug use: No   Sexual activity: Yes    Birth control/protection: Surgical    Comment: hyst  Other Topics Concern   Not on file  Social History Narrative   Not on file   Social Determinants of Health   Financial Resource Strain: Low Risk  (01/29/2021)   Overall Financial Resource Strain (CARDIA)    Difficulty of Paying Living Expenses: Not hard at all  Food Insecurity: No Food Insecurity (01/29/2021)   Hunger Vital Sign    Worried About Running Out of Food in the Last Year: Never true    [place] in the Last Year: Never true  Transportation Needs: No Transportation Needs (01/29/2021)   PRAPARE - Hydrologist (Medical): No    Lack of Transportation (Non-Medical): No  Physical Activity: Sufficiently Active (01/29/2021)   Exercise Vital Sign    Days of Exercise per Week: 5 days    Minutes of Exercise per Session: 90 min  Stress: Stress Concern Present (01/29/2021)   [place]    Feeling of Stress : To some extent  Social Connections: Socially Integrated (01/29/2021)   Social Connection and Isolation Panel [NHANES]    Frequency of Communication with  Friends and Family: Three times a week    Frequency of Social Gatherings with Friends and Family: Once a week    Attends Religious Services: More than 4 times per year    Active Member of [employer] or Organizations: Yes    Attends Archivist Meetings: 1 to 4 times per year    Marital Status: Married    Review of Systems Per HPI  Objective:  BP (!) 147/79   Pulse 63   Temp 98.3 F (36.8 C)   Wt 161 lb (73 kg)   SpO2 98%   BMI 28.98 kg/m      09/06/2022   11:43 AM 07/17/2022   10:54 AM 07/01/2022    9:27 AM  BP/Weight  Systolic BP 657 846 962  Diastolic BP 79 68 74  Wt. (Lbs) 161 161.8   BMI 28.98 kg/m2 29.12 kg/m2     Physical Exam Vitals and nursing  note reviewed.  Constitutional:      General: She is not in acute distress.    Appearance: Normal appearance.  HENT:     Head: Normocephalic and atraumatic.     Mouth/Throat:     Pharynx: Oropharynx is clear.  Eyes:     General:        Right eye: No discharge.        Left eye: No discharge.     Conjunctiva/sclera: Conjunctivae normal.  Cardiovascular:     Rate and Rhythm: Normal rate and regular rhythm.  Pulmonary:     Effort: Pulmonary effort is normal.     Breath sounds: Normal breath sounds. No wheezing, rhonchi or rales.  Neurological:     Mental Status: She is alert.  Psychiatric:        Mood and Affect: Mood normal.        Behavior: Behavior normal.     Lab Results  Component Value Date   WBC 5.5 10/10/2020   HGB 13.5 10/10/2020   HCT 40.9 10/10/2020   PLT 262 10/10/2020   GLUCOSE 139 (H) 06/24/2022   CHOL 180 06/24/2022   TRIG 79 06/24/2022   HDL 78 06/24/2022   LDLCALC 87 06/24/2022   ALT 57 (H) 06/24/2022   AST 33 06/24/2022   NA 141 06/24/2022   K 4.2 06/24/2022   CL 102 06/24/2022   CREATININE 0.91 06/24/2022   BUN 13 06/24/2022   CO2 24 06/24/2022   TSH 2.010 04/03/2015   INR 1.05 01/24/2012   HGBA1C 5.9 (H) 06/24/2022     Assessment & Plan:   Problem List Items Addressed This Visit       Respiratory   Subacute bronchitis    Treating with Tessalon and Augmentin.        Meds ordered this encounter  Medications   benzonatate (TESSALON) 200 MG capsule    Sig: Take 1 capsule (200 mg total) by mouth 3 (three) times daily as needed for cough.    Dispense:  30 capsule    Refill:  0   amoxicillin-clavulanate (AUGMENTIN) 875-125 MG tablet    Sig: Take 1 tablet by mouth 2 (two) times daily.    Dispense:  14 tablet    Refill:  0    Follow-up:  Return if symptoms worsen or fail to improve.  [place]

## 2022-09-06 NOTE — Assessment & Plan Note (Signed)
Treating with Tessalon and Augmentin.

## 2022-09-17 ENCOUNTER — Encounter: Payer: Self-pay | Admitting: *Deleted

## 2022-09-18 ENCOUNTER — Other Ambulatory Visit: Payer: Self-pay | Admitting: Nurse Practitioner

## 2022-09-18 DIAGNOSIS — I10 Essential (primary) hypertension: Secondary | ICD-10-CM

## 2022-09-24 DIAGNOSIS — I10 Essential (primary) hypertension: Secondary | ICD-10-CM | POA: Diagnosis not present

## 2022-09-24 DIAGNOSIS — R7303 Prediabetes: Secondary | ICD-10-CM | POA: Diagnosis not present

## 2022-09-24 DIAGNOSIS — Z1159 Encounter for screening for other viral diseases: Secondary | ICD-10-CM | POA: Diagnosis not present

## 2022-09-24 DIAGNOSIS — Z114 Encounter for screening for human immunodeficiency virus [HIV]: Secondary | ICD-10-CM | POA: Diagnosis not present

## 2022-09-24 DIAGNOSIS — R748 Abnormal levels of other serum enzymes: Secondary | ICD-10-CM | POA: Diagnosis not present

## 2022-09-24 DIAGNOSIS — E785 Hyperlipidemia, unspecified: Secondary | ICD-10-CM | POA: Diagnosis not present

## 2022-09-25 LAB — LIPID PANEL
Chol/HDL Ratio: 2.7 ratio (ref 0.0–4.4)
Cholesterol, Total: 204 mg/dL — ABNORMAL HIGH (ref 100–199)
HDL: 75 mg/dL (ref >39)
LDL Chol Calc (NIH): 115 mg/dL — ABNORMAL HIGH (ref 0–99)
Triglycerides: 77 mg/dL (ref 0–149)
VLDL Cholesterol Cal: 14 mg/dL (ref 5–40)

## 2022-09-25 LAB — HEMOGLOBIN A1C
Est. average glucose Bld gHb Est-mCnc: 123 mg/dL
Hgb A1c MFr Bld: 5.9 % — ABNORMAL HIGH (ref 4.8–5.6)

## 2022-09-25 LAB — CMP14+EGFR
ALT: 32 IU/L (ref 0–32)
AST: 25 IU/L (ref 0–40)
Albumin/Globulin Ratio: 2.1 (ref 1.2–2.2)
Albumin: 4.7 g/dL (ref 3.9–4.9)
Alkaline Phosphatase: 60 IU/L (ref 44–121)
BUN/Creatinine Ratio: 15 (ref 12–28)
BUN: 14 mg/dL (ref 8–27)
Bilirubin Total: 0.5 mg/dL (ref 0.0–1.2)
CO2: 26 mmol/L (ref 20–29)
Calcium: 9.6 mg/dL (ref 8.7–10.3)
Chloride: 103 mmol/L (ref 96–106)
Creatinine, Ser: 0.93 mg/dL (ref 0.57–1.00)
Globulin, Total: 2.2 g/dL (ref 1.5–4.5)
Glucose: 118 mg/dL — ABNORMAL HIGH (ref 70–99)
Potassium: 3.9 mmol/L (ref 3.5–5.2)
Sodium: 140 mmol/L (ref 134–144)
Total Protein: 6.9 g/dL (ref 6.0–8.5)
eGFR: 68 mL/min/{1.73_m2} (ref >59)

## 2022-09-25 LAB — HEPATITIS C ANTIBODY: Hep C Virus Ab: NONREACTIVE

## 2022-09-25 LAB — HIV ANTIBODY (ROUTINE TESTING W REFLEX): HIV Screen 4th Generation wRfx: NONREACTIVE

## 2022-09-30 ENCOUNTER — Encounter: Payer: Self-pay | Admitting: Adult Health

## 2022-09-30 ENCOUNTER — Ambulatory Visit: Payer: BC Managed Care – PPO | Admitting: Nurse Practitioner

## 2022-09-30 ENCOUNTER — Ambulatory Visit (INDEPENDENT_AMBULATORY_CARE_PROVIDER_SITE_OTHER): Payer: BC Managed Care – PPO | Admitting: Adult Health

## 2022-09-30 VITALS — BP 114/65 | HR 64 | Ht 63.0 in | Wt 158.4 lb

## 2022-09-30 VITALS — BP 127/80 | HR 57 | Temp 98.8°F | Ht 63.0 in | Wt 158.8 lb

## 2022-09-30 DIAGNOSIS — R42 Dizziness and giddiness: Secondary | ICD-10-CM | POA: Diagnosis not present

## 2022-09-30 DIAGNOSIS — E785 Hyperlipidemia, unspecified: Secondary | ICD-10-CM | POA: Diagnosis not present

## 2022-09-30 DIAGNOSIS — I10 Essential (primary) hypertension: Secondary | ICD-10-CM | POA: Diagnosis not present

## 2022-09-30 DIAGNOSIS — Z124 Encounter for screening for malignant neoplasm of cervix: Secondary | ICD-10-CM

## 2022-09-30 DIAGNOSIS — Z01419 Encounter for gynecological examination (general) (routine) without abnormal findings: Secondary | ICD-10-CM

## 2022-09-30 DIAGNOSIS — Z90710 Acquired absence of both cervix and uterus: Secondary | ICD-10-CM | POA: Insufficient documentation

## 2022-09-30 DIAGNOSIS — N644 Mastodynia: Secondary | ICD-10-CM | POA: Insufficient documentation

## 2022-09-30 DIAGNOSIS — Z1211 Encounter for screening for malignant neoplasm of colon: Secondary | ICD-10-CM | POA: Diagnosis not present

## 2022-09-30 LAB — HEMOCCULT GUIAC POC 1CARD (OFFICE): Fecal Occult Blood, POC: NEGATIVE

## 2022-09-30 NOTE — Progress Notes (Unsigned)
   Subjective:    Patient ID: Daisy Edwards, female    DOB: November 24, 1955, 66 y.o.   MRN: [id number]  HPI Patient  arrives to discuss blood work.  Patient states she feels off balance. Patent has not had any falls, if she gets up quickly she gets a little light headed. She states she has noticed the the light headiness more if she has not eaten.      Review of Systems     Objective:   Physical Exam        Assessment & Plan:

## 2022-09-30 NOTE — Progress Notes (Signed)
Patient ID: Daisy Edwards, female   DOB: 12/06/1955, 66 y.o.   MRN: [id number] History of Present Illness: Daisy Edwards is a 66 year old black female, married, sp hysterectomy, in for gyn exam. She had physical  06/21/22 with PCP.  PCP is Otila Kluver NP   Current Medications, Allergies, Past Medical History, Past Surgical History, Family History and Social History were reviewed in [employer] record.     Review of Systems: Patient denies any headaches, hearing loss, fatigue, blurred vision, shortness of breath, chest pain, abdominal pain, problems with bowel movements, urination, or intercourse. No joint pain or mood swings.  Has pain in left breast   Physical Exam:BP 114/65 (BP Location: Right Arm, Patient Position: Sitting, Cuff Size: Normal)   Pulse 64   Ht '5\' 3"'$  (1.6 m)   Wt 158 lb 6 oz (71.8 kg)   BMI 28.05 kg/m   General:  Well developed, well nourished, no acute distress Skin:  Warm and dry Neck:  Midline trachea, normal thyroid, good ROM, no lymphadenopathy,no carotid bruits heard Lungs; Clear to auscultation bilaterally Breast:  No dominant palpable mass, retraction, or nipple discharge, has point tenderness at 3 o'clock left breast, no mass does have irregular tissue Cardiovascular: Regular rate and rhythm Abdomen:  Soft, non tender, no hepatosplenomegaly Pelvic:  External genitalia is normal in appearance, no lesions.  The vagina is pale. Urethra has no lesions or masses. The cervix and uterus are absent.  No adnexal masses or tenderness noted.Bladder is non tender, no masses felt. Rectal: Good sphincter tone, no polyps, or hemorrhoids felt.  Hemoccult negative. Extremities/musculoskeletal:  No swelling or varicosities noted, no clubbing or cyanosis Psych:  No mood changes, alert and cooperative,seems happy AA is 2 Fall risk is low     09/30/2022    8:44 AM 07/01/2022    9:03 AM 03/26/2021    9:19 AM  Depression screen PHQ 2/9  Decreased Interest 0 0  0  Down, Depressed, Hopeless 0 0 0  PHQ - 2 Score 0 0 0  Altered sleeping 1    Tired, decreased energy 1    Change in appetite 1    Feeling bad or failure about yourself  0    Trouble concentrating 0    Moving slowly or fidgety/restless 0    Suicidal thoughts 0    PHQ-9 Score 3         09/30/2022    8:44 AM 01/29/2021    8:36 AM  GAD 7 : Generalized Anxiety Score  Nervous, Anxious, on Edge 0 1  Control/stop worrying 1 1  Worry too much - different things 1 1  Trouble relaxing 0 1  Restless 0 0  Easily annoyed or irritable 0 0  Afraid - awful might happen 1 0  Total GAD 7 Score 3 4      [place] - 09/30/22 0849       Pregnancy Intention Screening   Does the patient want to become pregnant in the next year? N/A    Does the patient's partner want to become pregnant in the next year? N/A    Would the patient like to discuss contraceptive options today? N/A      Contraception Wrap Up   Current Method No Method - Other Reason   hyst   End Method No Method - Other Reason   hyst   Contraception Counseling Provided No             Examination chaperoned by  Calandra RN  Impression and Plan: 1. Encounter for well woman exam with routine gynecological exam GYN exam in 1 year Physical and labs with PCP Colonoscopy per GI   2. Encounter for screening fecal occult blood testing Hemoccult was negative - POCT occult blood stool  3. Breast pain, left Will get left diagnostic mammogram and [place] 10/16/22 at 11:20 am at [facility]; Future - US BREAST LTD [facility] LEFT [facility]; Future  4. S/P hysterectomy

## 2022-10-01 ENCOUNTER — Encounter: Payer: Self-pay | Admitting: *Deleted

## 2022-10-02 ENCOUNTER — Encounter: Payer: Self-pay | Admitting: Nurse Practitioner

## 2022-10-02 MED ORDER — ROSUVASTATIN CALCIUM 20 MG PO TABS: 20.0000 mg | ORAL_TABLET | Freq: Every day | ORAL | 0 refills | Status: DC

## 2022-10-02 MED ORDER — AMLODIPINE BESYLATE 5 MG PO TABS: 5.0000 mg | ORAL_TABLET | Freq: Every day | ORAL | 0 refills | Status: DC

## 2022-10-16 ENCOUNTER — Ambulatory Visit (HOSPITAL_COMMUNITY)
Admission: RE | Admit: 2022-10-16 | Discharge: 2022-10-16 | Disposition: A | Discharge status: Home / Self Care | Payer: BC Managed Care – PPO | Source: Ambulatory Visit | Attending: Adult Health | Admitting: Adult Health

## 2022-10-16 DIAGNOSIS — N644 Mastodynia: Secondary | ICD-10-CM | POA: Diagnosis not present

## 2022-10-17 ENCOUNTER — Telehealth: Payer: Self-pay | Admitting: *Deleted

## 2022-10-17 NOTE — Telephone Encounter (Signed)
Left message @ 2:23 pm. JSY

## 2022-10-17 NOTE — Telephone Encounter (Signed)
Pt aware mammogram was negative and to repeat in 1 year. Pt voiced understanding. [place]

## 2022-10-17 NOTE — Telephone Encounter (Signed)
-----   Message from Estill Dooms, NP sent at 10/17/2022  1:47 PM EST ----- Let pt know mammogram was negative, repeat in 1 year [facility]

## 2022-10-30 ENCOUNTER — Other Ambulatory Visit: Payer: Self-pay | Admitting: Cardiology

## 2022-12-20 ENCOUNTER — Telehealth: Payer: Self-pay | Admitting: Family Medicine

## 2022-12-20 DIAGNOSIS — I10 Essential (primary) hypertension: Secondary | ICD-10-CM

## 2022-12-20 MED ORDER — LISINOPRIL-HYDROCHLOROTHIAZIDE 20-25 MG PO TABS: 1.0000 | ORAL_TABLET | Freq: Every day | ORAL | 0 refills | Status: DC

## 2022-12-20 NOTE — Telephone Encounter (Signed)
Refill on   lisinopril-hydrochlorothiazide (ZESTORETIC) 20-25 MG tablet  Send to [facility]

## 2022-12-20 NOTE — Telephone Encounter (Signed)
Refill sent to pharmacy with note that pt needs appt to est with Dr.Cook

## 2023-01-08 ENCOUNTER — Telehealth: Payer: Self-pay

## 2023-01-08 DIAGNOSIS — Z1211 Encounter for screening for malignant neoplasm of colon: Secondary | ICD-10-CM

## 2023-01-08 NOTE — Telephone Encounter (Signed)
Pt wants a referral for colonoscopy due this year and also wants to know if she needs lab work done for med appt?  most recent labs 09/2022 CMP, lipid, A1c. Please advise Pt call back [phone] Daisy Edwards

## 2023-01-08 NOTE — Telephone Encounter (Signed)
Pt wants a referral for colonoscopy due this year and also wants to know if she needs lab work done for med appt?   Pt call back [phone] Pasty Spillers

## 2023-01-09 ENCOUNTER — Encounter: Payer: Self-pay | Admitting: *Deleted

## 2023-01-09 ENCOUNTER — Other Ambulatory Visit: Payer: Self-pay | Admitting: *Deleted

## 2023-01-09 NOTE — Telephone Encounter (Signed)
Left message for patient to return the call for additional details and recommendations.   

## 2023-01-09 NOTE — Telephone Encounter (Signed)
Coral Spikes, DO     01/09/23  8:58 AM Go ahead and place referral for colonoscopy.  No labs needed at this time

## 2023-01-09 NOTE — Telephone Encounter (Signed)
Referral ordered in EPIC. Patient notified.

## 2023-01-23 ENCOUNTER — Ambulatory Visit: Payer: BC Managed Care – PPO | Admitting: Family Medicine

## 2023-01-23 VITALS — BP 130/71 | HR 67 | Temp 97.5°F | Ht 63.0 in | Wt 163.0 lb

## 2023-01-23 DIAGNOSIS — I10 Essential (primary) hypertension: Secondary | ICD-10-CM | POA: Diagnosis not present

## 2023-01-23 DIAGNOSIS — I2089 Other forms of angina pectoris: Secondary | ICD-10-CM | POA: Diagnosis not present

## 2023-01-23 DIAGNOSIS — R21 Rash and other nonspecific skin eruption: Secondary | ICD-10-CM | POA: Diagnosis not present

## 2023-01-23 DIAGNOSIS — E785 Hyperlipidemia, unspecified: Secondary | ICD-10-CM | POA: Diagnosis not present

## 2023-01-23 MED ORDER — AMLODIPINE BESYLATE 5 MG PO TABS: 5.0000 mg | ORAL_TABLET | Freq: Every day | ORAL | 3 refills | Status: DC

## 2023-01-23 MED ORDER — ROSUVASTATIN CALCIUM 20 MG PO TABS: 20.0000 mg | ORAL_TABLET | Freq: Every day | ORAL | 3 refills | Status: DC

## 2023-01-23 MED ORDER — LISINOPRIL-HYDROCHLOROTHIAZIDE 20-25 MG PO TABS: 1.0000 | ORAL_TABLET | Freq: Every day | ORAL | 3 refills | Status: DC

## 2023-01-23 MED ORDER — TRIAMCINOLONE ACETONIDE 0.5 % EX OINT: 1.0000 | TOPICAL_OINTMENT | Freq: Every day | CUTANEOUS | 0 refills | Status: DC | PRN

## 2023-01-23 NOTE — Assessment & Plan Note (Signed)
Will need lipid panel at next visit.  Continue statin.  Refilled today.

## 2023-01-23 NOTE — Assessment & Plan Note (Signed)
Well-controlled.  Continue current medications.  Refilled today.

## 2023-01-23 NOTE — Patient Instructions (Signed)
I have refilled your medication and send something in for the area that is itching.  Follow-up in 6 months.  Take care  Dr. Lacinda Axon

## 2023-01-23 NOTE — Assessment & Plan Note (Signed)
Stable. 

## 2023-01-23 NOTE — Progress Notes (Signed)
Subjective:  Patient ID: Daisy Edwards, female    DOB: 09-20-1956  Age: 67 y.o. MRN: [id number]  CC: Chief Complaint  Patient presents with   Hypertension   Anxiety    HPI:  67 year old female with hypertension, microvascular angina, hyperlipidemia, prediabetes presents for follow-up.  Patient is doing well.  Blood pressure is well-controlled on amlodipine, lisinopril/HCTZ.  Patient denies chest pain or shortness of breath.  She remains on ranolazine regarding her microvascular angina.  Patient is on statin therapy.  Last lipid panel revealed an elevated LDL.  Will need to recheck in the future.  Patient's only complaint today is that she has an area that causes itching on her left upper back.  She would like me to examine this today.  Patient Active Problem List   Diagnosis Date Noted   Microvascular angina 01/23/2023   Rash 01/23/2023   S/P hysterectomy 09/30/2022   Prediabetes 02/14/2018   Varicose veins of lower extremities with other complications [id number]   Hyperlipidemia 03/15/2013   Cervical stenosis of spinal canal 01/29/2012    Class: Diagnosis of   Essential hypertension, benign 03/20/2009    Social Hx   Social History   Socioeconomic History   Marital status: Married    Spouse name: Not on file   Number of children: 0   Years of education: Not on file   Highest education level: Not on file  Occupational History    Employer: [employer]  Tobacco Use   Smoking status: Former    Years: 0.50    Types: Cigarettes   Smokeless tobacco: Never   Tobacco comments:    quit 30 years ago.   Vaping Use   Vaping Use: Never used  Substance and Sexual Activity   Alcohol use: Yes    Alcohol/week: 4.0 standard drinks of alcohol    Types: 4 Glasses of wine per week    Comment: wine, socially    Drug use: No   Sexual activity: Yes    Birth control/protection: Surgical    Comment: hyst  Other Topics Concern   Not on file  Social History Narrative    Not on file   Social Determinants of Health   Financial Resource Strain: Low Risk  (09/30/2022)   Overall Financial Resource Strain (CARDIA)    Difficulty of Paying Living Expenses: Not hard at all  Food Insecurity: No Food Insecurity (09/30/2022)   Hunger Vital Sign    Worried About Running Out of Food in the Last Year: Never true    Ran Out of Food in the Last Year: Never true  Transportation Needs: No Transportation Needs (09/30/2022)   PRAPARE - Hydrologist (Medical): No    Lack of Transportation (Non-Medical): No  Physical Activity: Sufficiently Active (09/30/2022)   Exercise Vital Sign    Days of Exercise per Week: 3 days    Minutes of Exercise per Session: 60 min  Stress: No Stress Concern Present (09/30/2022)   [place]    Feeling of Stress : Only a little  Social Connections: Socially Integrated (09/30/2022)   Social Connection and Isolation Panel [NHANES]    Frequency of Communication with Friends and Family: More than three times a week    Frequency of Social Gatherings with Friends and Family: Once a week    Attends Religious Services: More than 4 times per year    Active Member of [employer] or Organizations: Yes  Attends Music therapist: More than 4 times per year    Marital Status: Married    Review of Systems Per HPI  Objective:  BP 130/71   Pulse 67   Temp (!) 97.5 F (36.4 C)   Ht '5\' 3"'$  (1.6 m)   Wt 163 lb (73.9 kg)   SpO2 98%   BMI 28.87 kg/m      01/23/2023    9:24 AM 09/30/2022    9:40 AM 09/30/2022    9:30 AM  BP/Weight  Systolic BP [id number]  Diastolic BP 71 80 82  Wt. (Lbs) 163  158.8  BMI 28.87 kg/m2  28.13 kg/m2    Physical Exam Constitutional:      General: She is not in acute distress.    Appearance: Normal appearance.  HENT:     Head: Normocephalic and atraumatic.  Cardiovascular:     Rate and Rhythm: Normal rate  and regular rhythm.  Pulmonary:     Effort: Pulmonary effort is normal.     Breath sounds: Normal breath sounds. No wheezing, rhonchi or rales.  Skin:    Comments: Left upper back with mild erythematous raised rash.  Neurological:     Mental Status: She is alert.  Psychiatric:        Mood and Affect: Mood normal.        Behavior: Behavior normal.     Lab Results  Component Value Date   WBC 5.5 10/10/2020   HGB 13.5 10/10/2020   HCT 40.9 10/10/2020   PLT 262 10/10/2020   GLUCOSE 118 (H) 09/24/2022   CHOL 204 (H) 09/24/2022   TRIG 77 09/24/2022   HDL 75 09/24/2022   LDLCALC 115 (H) 09/24/2022   ALT 32 09/24/2022   AST 25 09/24/2022   NA 140 09/24/2022   K 3.9 09/24/2022   CL 103 09/24/2022   CREATININE 0.93 09/24/2022   BUN 14 09/24/2022   CO2 26 09/24/2022   TSH 2.010 04/03/2015   INR 1.05 01/24/2012   HGBA1C 5.9 (H) 09/24/2022     Assessment & Plan:   Problem List Items Addressed This Visit       Cardiovascular and Mediastinum   Essential hypertension, benign - Primary    Well-controlled.  Continue current medications.  Refilled today.      Relevant Medications   lisinopril-hydrochlorothiazide (ZESTORETIC) 20-25 MG tablet   amLODipine (NORVASC) 5 MG tablet   rosuvastatin (CRESTOR) 20 MG tablet   Microvascular angina    Stable.      Relevant Medications   lisinopril-hydrochlorothiazide (ZESTORETIC) 20-25 MG tablet   amLODipine (NORVASC) 5 MG tablet   rosuvastatin (CRESTOR) 20 MG tablet     Musculoskeletal and Integument   Rash    Treating with triamcinolone.        Other   Hyperlipidemia    Will need lipid panel at next visit.  Continue statin.  Refilled today.      Relevant Medications   lisinopril-hydrochlorothiazide (ZESTORETIC) 20-25 MG tablet   amLODipine (NORVASC) 5 MG tablet   rosuvastatin (CRESTOR) 20 MG tablet    Meds ordered this encounter  Medications   lisinopril-hydrochlorothiazide (ZESTORETIC) 20-25 MG tablet    Sig: Take  1 tablet by mouth daily.    Dispense:  90 tablet    Refill:  3   amLODipine (NORVASC) 5 MG tablet    Sig: Take 1 tablet (5 mg total) by mouth at bedtime.    Dispense:  90 tablet  Refill:  3   rosuvastatin (CRESTOR) 20 MG tablet    Sig: Take 1 tablet (20 mg total) by mouth daily.    Dispense:  90 tablet    Refill:  3   triamcinolone ointment (KENALOG) 0.5 %    Sig: Apply 1 Application topically daily as needed.    Dispense:  30 g    Refill:  0    Follow-up:  Return in about 6 months (around 07/26/2023).  [place]

## 2023-01-23 NOTE — Assessment & Plan Note (Signed)
Treating with triamcinolone.

## 2023-03-31 ENCOUNTER — Other Ambulatory Visit (HOSPITAL_COMMUNITY): Payer: Self-pay | Admitting: Adult Health

## 2023-03-31 DIAGNOSIS — Z1231 Encounter for screening mammogram for malignant neoplasm of breast: Secondary | ICD-10-CM

## 2023-04-09 ENCOUNTER — Ambulatory Visit (HOSPITAL_COMMUNITY)
Admission: RE | Admit: 2023-04-09 | Discharge: 2023-04-09 | Disposition: A | Discharge status: Home / Self Care | Payer: BC Managed Care – PPO | Source: Ambulatory Visit | Attending: Adult Health | Admitting: Adult Health

## 2023-04-09 DIAGNOSIS — Z1231 Encounter for screening mammogram for malignant neoplasm of breast: Secondary | ICD-10-CM | POA: Insufficient documentation

## 2023-04-28 DIAGNOSIS — H43812 Vitreous degeneration, left eye: Secondary | ICD-10-CM | POA: Diagnosis not present

## 2023-04-28 DIAGNOSIS — H35033 Hypertensive retinopathy, bilateral: Secondary | ICD-10-CM | POA: Diagnosis not present

## 2023-04-28 DIAGNOSIS — H2189 Other specified disorders of iris and ciliary body: Secondary | ICD-10-CM | POA: Diagnosis not present

## 2023-04-28 DIAGNOSIS — Z961 Presence of intraocular lens: Secondary | ICD-10-CM | POA: Diagnosis not present

## 2023-06-24 ENCOUNTER — Encounter: Payer: Self-pay | Admitting: *Deleted

## 2023-07-17 ENCOUNTER — Ambulatory Visit: Payer: BC Managed Care – PPO | Attending: Cardiology | Admitting: Cardiology

## 2023-07-17 ENCOUNTER — Encounter: Payer: Self-pay | Admitting: Cardiology

## 2023-07-17 VITALS — BP 138/80 | HR 68 | Ht 63.0 in | Wt 164.0 lb

## 2023-07-17 DIAGNOSIS — I10 Essential (primary) hypertension: Secondary | ICD-10-CM

## 2023-07-17 DIAGNOSIS — E782 Mixed hyperlipidemia: Secondary | ICD-10-CM

## 2023-07-17 DIAGNOSIS — I2089 Other forms of angina pectoris: Secondary | ICD-10-CM

## 2023-07-17 NOTE — Progress Notes (Signed)
    Cardiology Office Note  Date: 07/17/2023   ID: Daisy Edwards, DOB August 25, 1956, MRN [id number]  History of Present Illness: Daisy Edwards is a 67 y.o. female last seen in August 2023.  She is here for a follow-up visit.  Reports no progressive angina with typical activities, she enjoys walking for exercise, up to a few miles at a time.  No increasing dyspnea with typical activities, no palpitations or syncope.  I reviewed her medications.  She continues to follow regularly with Dr. Adriana Simas and will have repeat lab work later this year.  ECG today shows normal sinus rhythm.  Physical Exam: VS:  BP 138/80 (BP Location: Left Arm, Patient Position: Sitting, Cuff Size: Normal)   Pulse 68   Ht 5\' 3"  (1.6 m)   Wt 164 lb (74.4 kg)   SpO2 97%   BMI 29.05 kg/m , BMI Body mass index is 29.05 kg/m.  Wt Readings from Last 3 Encounters:  07/17/23 164 lb (74.4 kg)  01/23/23 163 lb (73.9 kg)  09/30/22 158 lb 12.8 oz (72 kg)    General: Patient appears comfortable at rest. HEENT: Conjunctiva and lids normal. Neck: Supple, no elevated JVP or carotid bruits. Lungs: Clear to auscultation, nonlabored breathing at rest. Cardiac: Regular rate and rhythm, no S3 or significant systolic murmur. Extremities: No pitting edema.  ECG:  An ECG dated 07/17/2022 was personally reviewed today and demonstrated:  Sinus rhythm with decreased R wave progression.  Labwork: 09/24/2022: ALT 32; AST 25; BUN 14; Creatinine, Ser 0.93; Potassium 3.9; Sodium 140     Component Value Date/Time   CHOL 204 (H) 09/24/2022 1045   TRIG 77 09/24/2022 1045   HDL 75 09/24/2022 1045   CHOLHDL 2.7 09/24/2022 1045   CHOLHDL 2.7 01/30/2015 1429   VLDL 15 01/30/2015 1429   LDLCALC 115 (H) 09/24/2022 1045   Other Studies Reviewed Today:  No interval cardiac testing for review today.  Assessment and Plan:  1.  Microvascular angina.  No progressive symptoms, continuing with regular walking plan for exercise.  ECG reviewed  today.  Continue aspirin, Ranexa, and Crestor.  2.  Mixed hyperlipidemia.  Currently on Crestor 20 mg daily, she reports compliance with therapy.  LDL was 115 and HDL 75 in November of last year with follow-up lab work pending per PCP at physical this year.  May need to consider upping dose further depending on results.  3.  Essential hypertension.  Continues on Zestoretic and Norvasc, no changes were made today.  Disposition:  Follow up  1 year.  Signed, Jonelle Sidle, M.D., F.A.C.C. [facility]

## 2023-07-17 NOTE — Patient Instructions (Signed)
Medication Instructions:  Your physician recommends that you continue on your current medications as directed. Please refer to the Current Medication list given to you today.   Labwork: None today  Testing/Procedures: None today  Follow-Up: 1 year  Any Other Special Instructions Will Be Listed Below (If Applicable).  If you need a refill on your cardiac medications before your next appointment, please call your pharmacy.  

## 2023-07-28 ENCOUNTER — Encounter: Payer: Self-pay | Admitting: Family Medicine

## 2023-07-28 ENCOUNTER — Ambulatory Visit (INDEPENDENT_AMBULATORY_CARE_PROVIDER_SITE_OTHER): Payer: BC Managed Care – PPO | Admitting: Family Medicine

## 2023-07-28 VITALS — BP 142/76 | HR 61 | Temp 98.1°F | Wt 162.0 lb

## 2023-07-28 DIAGNOSIS — I10 Essential (primary) hypertension: Secondary | ICD-10-CM

## 2023-07-28 DIAGNOSIS — Z23 Encounter for immunization: Secondary | ICD-10-CM

## 2023-07-28 DIAGNOSIS — R7303 Prediabetes: Secondary | ICD-10-CM | POA: Diagnosis not present

## 2023-07-28 DIAGNOSIS — Z130 Encounter for screening for diseases of the blood and blood-forming organs and certain disorders involving the immune mechanism: Secondary | ICD-10-CM | POA: Diagnosis not present

## 2023-07-28 DIAGNOSIS — E785 Hyperlipidemia, unspecified: Secondary | ICD-10-CM | POA: Diagnosis not present

## 2023-07-28 DIAGNOSIS — I2089 Other forms of angina pectoris: Secondary | ICD-10-CM

## 2023-07-28 NOTE — Assessment & Plan Note (Signed)
Fair control. Continue current medications. 

## 2023-07-28 NOTE — Patient Instructions (Signed)
Labs today.  Continue your medications.  Follow up in 6 months.

## 2023-07-28 NOTE — Assessment & Plan Note (Signed)
Stable.  Continue current medications.

## 2023-07-28 NOTE — Assessment & Plan Note (Signed)
Lipid panel today to assess.  Continue Crestor. 

## 2023-07-28 NOTE — Progress Notes (Signed)
Subjective:  Patient ID: Daisy Edwards, female    DOB: [birth year]  Age: 67 y.o. MRN: [id number]  CC: Chief Complaint  Patient presents with   Follow-up    6 month for HTN    Hyperlipidemia    6 month fu     HPI:  67 year old female presents for follow-up.  Patient states that she is feeling well.  Denies chest pain or shortness of breath.  Patient reports ongoing knee pain.  Desires flu vaccine today.  BP mildly elevated here today.  She is compliant with lisinopril/HCTZ and amlodipine.  Needs lipid panel.  Last LDL was not at goal.  She is currently on Crestor 20 mg daily.  Patient Active Problem List   Diagnosis Date Noted   Microvascular angina 01/23/2023   S/P hysterectomy 09/30/2022   Prediabetes 02/14/2018   Varicose veins of lower extremities with other complications 07/19/2014   Hyperlipidemia 03/15/2013   Cervical stenosis of spinal canal 01/29/2012    Class: Diagnosis of   Essential hypertension, benign 03/20/2009    Social Hx   Social History   Socioeconomic History   Marital status: Married    Spouse name: Not on file   Number of children: 0   Years of education: Not on file   Highest education level: Not on file  Occupational History    Employer: [employer]  Tobacco Use   Smoking status: Former    Types: Cigarettes   Smokeless tobacco: Never   Tobacco comments:    quit 30 years ago.   Vaping Use   Vaping status: Never Used  Substance and Sexual Activity   Alcohol use: Yes    Alcohol/week: 4.0 standard drinks of alcohol    Types: 4 Glasses of wine per week    Comment: wine, socially    Drug use: No   Sexual activity: Yes    Birth control/protection: Surgical    Comment: hyst  Other Topics Concern   Not on file  Social History Narrative   Not on file   Social Determinants of Health   Financial Resource Strain: Low Risk  (09/30/2022)   Overall Financial Resource Strain (CARDIA)    Difficulty of Paying Living Expenses:  Not hard at all  Food Insecurity: No Food Insecurity (09/30/2022)   Hunger Vital Sign    Worried About Running Out of Food in the Last Year: Never true    Ran Out of Food in the Last Year: Never true  Transportation Needs: No Transportation Needs (09/30/2022)   PRAPARE - Administrator, Civil Service (Medical): No    Lack of Transportation (Non-Medical): No  Physical Activity: Sufficiently Active (09/30/2022)   Exercise Vital Sign    Days of Exercise per Week: 3 days    Minutes of Exercise per Session: 60 min  Stress: No Stress Concern Present (09/30/2022)   [employer] of Occupational Health - Occupational Stress Questionnaire    Feeling of Stress : Only a little  Social Connections: Socially Integrated (09/30/2022)   Social Connection and Isolation Panel [NHANES]    Frequency of Communication with Friends and Family: More than three times a week    Frequency of Social Gatherings with Friends and Family: Once a week    Attends Religious Services: More than 4 times per year    Active Member of [employer] or Organizations: Yes    Attends Engineer, structural: More than 4 times per year    Marital Status: Married  Review of Systems Per HPI  Objective:  BP (!) 142/76   Pulse 61   Temp 98.1 F (36.7 C) (Oral)   Wt 162 lb (73.5 kg)   SpO2 98%   BMI 28.70 kg/m      07/28/2023   10:10 AM 07/17/2023   10:23 AM 01/23/2023    9:24 AM  BP/Weight  Systolic BP 142 138 130  Diastolic BP 76 80 71  Wt. (Lbs) 162 164 163  BMI 28.7 kg/m2 29.05 kg/m2 28.87 kg/m2    Physical Exam Vitals and nursing note reviewed.  Constitutional:      General: She is not in acute distress.    Appearance: Normal appearance.  HENT:     Head: Normocephalic and atraumatic.  Eyes:     General:        Right eye: No discharge.        Left eye: No discharge.     Conjunctiva/sclera: Conjunctivae normal.  Cardiovascular:     Rate and Rhythm: Normal rate and regular rhythm.   Pulmonary:     Effort: Pulmonary effort is normal.     Breath sounds: Normal breath sounds. No wheezing, rhonchi or rales.  Neurological:     Mental Status: She is alert.  Psychiatric:        Mood and Affect: Mood normal.        Behavior: Behavior normal.     Lab Results  Component Value Date   WBC 5.5 10/10/2020   HGB 13.5 10/10/2020   HCT 40.9 10/10/2020   PLT 262 10/10/2020   GLUCOSE 118 (H) 09/24/2022   CHOL 204 (H) 09/24/2022   TRIG 77 09/24/2022   HDL 75 09/24/2022   LDLCALC 115 (H) 09/24/2022   ALT 32 09/24/2022   AST 25 09/24/2022   NA 140 09/24/2022   K 3.9 09/24/2022   CL 103 09/24/2022   CREATININE 0.93 09/24/2022   BUN 14 09/24/2022   CO2 26 09/24/2022   TSH 2.010 04/03/2015   INR 1.05 01/24/2012   HGBA1C 5.9 (H) 09/24/2022     Assessment & Plan:   Problem List Items Addressed This Visit       Cardiovascular and Mediastinum   Microvascular angina    Stable.  Continue current medications.      Essential hypertension, benign - Primary    Fair control.  Continue current medications.      Relevant Orders   CMP14+EGFR     Other   Prediabetes   Relevant Orders   Hemoglobin A1c   Hyperlipidemia    Lipid panel today to assess.  Continue Crestor.      Relevant Orders   Lipid panel   Other Visit Diagnoses     Screening for deficiency anemia       Relevant Orders   CBC   Immunization due       Relevant Orders   Flu Vaccine Trivalent High Dose (Fluad) (Completed)       Follow-up:  6 months  Denise Bramblett Adriana Simas DO [facility]

## 2023-07-30 ENCOUNTER — Telehealth: Payer: Self-pay | Admitting: Family Medicine

## 2023-07-30 NOTE — Telephone Encounter (Signed)
Patient was seen on 9/9 and forgot to request a referral for colonoscopy.

## 2023-07-31 ENCOUNTER — Encounter (INDEPENDENT_AMBULATORY_CARE_PROVIDER_SITE_OTHER): Payer: Self-pay | Admitting: *Deleted

## 2023-07-31 ENCOUNTER — Other Ambulatory Visit: Payer: Self-pay

## 2023-07-31 DIAGNOSIS — Z1211 Encounter for screening for malignant neoplasm of colon: Secondary | ICD-10-CM

## 2023-07-31 NOTE — Telephone Encounter (Signed)
Tommie Sams, DO     Please place referral to GI.

## 2023-08-01 DIAGNOSIS — I10 Essential (primary) hypertension: Secondary | ICD-10-CM | POA: Diagnosis not present

## 2023-08-01 DIAGNOSIS — R7303 Prediabetes: Secondary | ICD-10-CM | POA: Diagnosis not present

## 2023-08-01 DIAGNOSIS — Z130 Encounter for screening for diseases of the blood and blood-forming organs and certain disorders involving the immune mechanism: Secondary | ICD-10-CM | POA: Diagnosis not present

## 2023-08-01 DIAGNOSIS — E785 Hyperlipidemia, unspecified: Secondary | ICD-10-CM | POA: Diagnosis not present

## 2023-08-02 LAB — CMP14+EGFR
ALT: 18 IU/L (ref 0–32)
AST: 18 IU/L (ref 0–40)
Albumin: 4.4 g/dL (ref 3.9–4.9)
Alkaline Phosphatase: 62 IU/L (ref 44–121)
BUN/Creatinine Ratio: 15 (ref 12–28)
BUN: 14 mg/dL (ref 8–27)
Bilirubin Total: 0.5 mg/dL (ref 0.0–1.2)
CO2: 26 mmol/L (ref 20–29)
Calcium: 9.5 mg/dL (ref 8.7–10.3)
Chloride: 102 mmol/L (ref 96–106)
Creatinine, Ser: 0.96 mg/dL (ref 0.57–1.00)
Globulin, Total: 2.1 g/dL (ref 1.5–4.5)
Glucose: 115 mg/dL — ABNORMAL HIGH (ref 70–99)
Potassium: 3.9 mmol/L (ref 3.5–5.2)
Sodium: 140 mmol/L (ref 134–144)
Total Protein: 6.5 g/dL (ref 6.0–8.5)
eGFR: 65 mL/min/{1.73_m2} (ref >59)

## 2023-08-02 LAB — CBC
Hematocrit: 42.2 % (ref 34.0–46.6)
Hemoglobin: 13.8 g/dL (ref 11.1–15.9)
MCH: 27.3 pg (ref 26.6–33.0)
MCHC: 32.7 g/dL (ref 31.5–35.7)
MCV: 83 fL (ref 79–97)
Platelets: 220 10*3/uL (ref 150–450)
RBC: 5.06 x10E6/uL (ref 3.77–5.28)
RDW: 13.1 % (ref 11.7–15.4)
WBC: 3.7 10*3/uL (ref 3.4–10.8)

## 2023-08-02 LAB — HEMOGLOBIN A1C
Est. average glucose Bld gHb Est-mCnc: 126 mg/dL
Hgb A1c MFr Bld: 6 % — ABNORMAL HIGH (ref 4.8–5.6)

## 2023-08-02 LAB — LIPID PANEL
Chol/HDL Ratio: 2.7 ratio (ref 0.0–4.4)
Cholesterol, Total: 187 mg/dL (ref 100–199)
HDL: 69 mg/dL (ref >39)
LDL Chol Calc (NIH): 105 mg/dL — ABNORMAL HIGH (ref 0–99)
Triglycerides: 71 mg/dL (ref 0–149)
VLDL Cholesterol Cal: 13 mg/dL (ref 5–40)

## 2023-08-05 ENCOUNTER — Other Ambulatory Visit: Payer: Self-pay

## 2023-08-05 MED ORDER — ROSUVASTATIN CALCIUM 40 MG PO TABS: 40.0000 mg | ORAL_TABLET | Freq: Every day | ORAL | 3 refills | Status: DC

## 2023-08-20 ENCOUNTER — Telehealth: Payer: Self-pay | Admitting: Internal Medicine

## 2023-08-20 DIAGNOSIS — Z860100 Personal history of colon polyps, unspecified: Secondary | ICD-10-CM

## 2023-08-20 NOTE — Telephone Encounter (Signed)
Questionnaire in review

## 2023-08-21 NOTE — Telephone Encounter (Signed)
  Procedure: Colonoscopy  Estimated body mass index is 28.7 kg/m as calculated from the following:   Height as of 07/17/23: 5\' 3"  (1.6 m).   Weight as of 07/28/23: 162 lb (73.5 kg).   Have you had a colonoscopy before?  10/06/17, Dr. Darrick Penna  Do you have a family history of polyps? no  Previous colonoscopy with polyps removed? yes  Do you have a history colorectal cancer?   no  Are you diabetic?  no  Do you have a prosthetic or mechanical heart valve? no  Do you have a pacemaker/defibrillator?   no  Have you had endocarditis/atrial fibrillation?  no  Do you use supplemental oxygen/CPAP?  no  Have you had joint replacement within the last 12 months?  no  Do you tend to be constipated or have to use laxatives?  no   Do you have history of alcohol use? If yes, how much and how often.  no  Do you have history or are you using drugs? If yes, what do are you  using?  no  Have you ever had a stroke/heart attack?  no  Have you ever had a heart or other vascular stent placed,?no  Do you take weight loss medication? no  female patients,: have you had a hysterectomy? yes                              are you post menopausal?  yes                              do you still have your menstrual cycle? no    Date of last menstrual period?   Do you take any blood-thinning medications such as: (Plavix, aspirin, Coumadin, Aggrenox, Brilinta, Xarelto, Eliquis, Pradaxa, Savaysa or Effient)? Aspirin 81mg   If yes we need the name, milligram, dosage and who is prescribing doctor:               Current Outpatient Medications  Medication Sig Dispense Refill   amLODipine (NORVASC) 5 MG tablet Take 1 tablet (5 mg total) by mouth at bedtime. 90 tablet 3   aspirin 81 MG tablet Take 81 mg by mouth every other day.     Cholecalciferol 50 MCG (2000 UT) CAPS Take by mouth.     lisinopril-hydrochlorothiazide (ZESTORETIC) 20-25 MG tablet Take 1 tablet by mouth daily. 90 tablet 3   Multiple Vitamin  (MULITIVITAMIN WITH MINERALS) TABS Take 1 tablet by mouth daily.     Omega-3 Fatty Acids (FISH OIL TRIPLE STRENGTH PO) Take 1,500 mg by mouth daily.      ranolazine (RANEXA) 500 MG 12 hr tablet Take 1 tablet by mouth twice daily 180 tablet 2   rosuvastatin (CRESTOR) 40 MG tablet Take 1 tablet (40 mg total) by mouth daily. 90 tablet 3   triamcinolone ointment (KENALOG) 0.5 % Apply 1 Application topically daily as needed. 30 g 0   TURMERIC PO Take 1 capsule by mouth daily as needed (joint pain).     No current facility-administered medications for this visit.    Allergies  Allergen Reactions   Vitamin C Itching    Cream: neck started burning

## 2023-08-29 NOTE — Telephone Encounter (Signed)
ASA 2. BMP pre-op.

## 2023-09-02 MED ORDER — NA SULFATE-K SULFATE-MG SULF 17.5-3.13-1.6 GM/177ML PO SOLN: ORAL | 0 refills | Status: DC

## 2023-09-02 NOTE — Addendum Note (Signed)
Addended by: Armstead Peaks on: 09/02/2023 03:22 PM   Modules accepted: Orders

## 2023-09-02 NOTE — Telephone Encounter (Signed)
Per [facility] "The member does not have SOC coverage. The procedure cannot be added"

## 2023-09-02 NOTE — Telephone Encounter (Signed)
LMTRC

## 2023-09-02 NOTE — Telephone Encounter (Signed)
Pt called back. Scheduled for 11/26. Aware will send instructions to her. Rx for prep sent to pharmacy.

## 2023-09-04 ENCOUNTER — Encounter: Payer: Self-pay | Admitting: *Deleted

## 2023-09-04 ENCOUNTER — Other Ambulatory Visit: Payer: Self-pay | Admitting: Cardiology

## 2023-09-04 ENCOUNTER — Encounter (INDEPENDENT_AMBULATORY_CARE_PROVIDER_SITE_OTHER): Payer: Self-pay | Admitting: *Deleted

## 2023-09-04 NOTE — Telephone Encounter (Signed)
Referral completed, TCS apt letter sent to PCP

## 2023-09-30 DIAGNOSIS — H20041 Secondary noninfectious iridocyclitis, right eye: Secondary | ICD-10-CM | POA: Diagnosis not present

## 2023-09-30 DIAGNOSIS — H43813 Vitreous degeneration, bilateral: Secondary | ICD-10-CM | POA: Diagnosis not present

## 2023-09-30 DIAGNOSIS — S0501XA Injury of conjunctiva and corneal abrasion without foreign body, right eye, initial encounter: Secondary | ICD-10-CM | POA: Diagnosis not present

## 2023-10-06 ENCOUNTER — Other Ambulatory Visit (HOSPITAL_COMMUNITY)
Admission: RE | Admit: 2023-10-06 | Discharge: 2023-10-06 | Disposition: A | Discharge status: Home / Self Care | Payer: BC Managed Care – PPO | Source: Ambulatory Visit | Attending: Internal Medicine | Admitting: Internal Medicine

## 2023-10-06 ENCOUNTER — Ambulatory Visit: Payer: BC Managed Care – PPO | Admitting: Adult Health

## 2023-10-06 ENCOUNTER — Encounter: Payer: Self-pay | Admitting: Adult Health

## 2023-10-06 VITALS — BP 119/73 | HR 66 | Ht 63.0 in | Wt 160.0 lb

## 2023-10-06 DIAGNOSIS — Z860100 Personal history of colon polyps, unspecified: Secondary | ICD-10-CM | POA: Insufficient documentation

## 2023-10-06 DIAGNOSIS — Z90710 Acquired absence of both cervix and uterus: Secondary | ICD-10-CM | POA: Diagnosis not present

## 2023-10-06 DIAGNOSIS — Z1211 Encounter for screening for malignant neoplasm of colon: Secondary | ICD-10-CM | POA: Insufficient documentation

## 2023-10-06 DIAGNOSIS — Z01419 Encounter for gynecological examination (general) (routine) without abnormal findings: Secondary | ICD-10-CM | POA: Diagnosis not present

## 2023-10-06 DIAGNOSIS — Z1331 Encounter for screening for depression: Secondary | ICD-10-CM

## 2023-10-06 LAB — BASIC METABOLIC PANEL
Anion gap: 9 (ref 5–15)
BUN: 20 mg/dL (ref 8–23)
CO2: 27 mmol/L (ref 22–32)
Calcium: 8.9 mg/dL (ref 8.9–10.3)
Chloride: 101 mmol/L (ref 98–111)
Creatinine, Ser: 0.88 mg/dL (ref 0.44–1.00)
GFR, Estimated: >60 mL/min (ref >60)
Glucose, Bld: 129 mg/dL — ABNORMAL HIGH (ref 70–99)
Potassium: 3.4 mmol/L — ABNORMAL LOW (ref 3.5–5.1)
Sodium: 137 mmol/L (ref 135–145)

## 2023-10-06 LAB — HEMOCCULT GUIAC POC 1CARD (OFFICE): Fecal Occult Blood, POC: NEGATIVE

## 2023-10-06 NOTE — Progress Notes (Signed)
Patient ID: Daisy Edwards, female   DOB: 1956-08-28, 67 y.o.   MRN: [id number] History of Present Illness: Daisy Edwards  is a 67 year old black female, married, sp hysterectomy in for a well woman gyn exam.  PCP is Dr Adriana Simas.    Current Medications, Allergies, Past Medical History, Past Surgical History, Family History and Social History were reviewed in [employer] record.     Review of Systems: Patient denies any headaches, hearing loss, fatigue, blurred vision, shortness of breath, chest pain, abdominal pain, problems with bowel movements, urination, or intercourse(not often). No joint pain or mood swings.     Physical Exam:BP 119/73 (BP Location: Right Arm, Patient Position: Sitting, Cuff Size: Normal)   Pulse 66   Ht 5\' 3"  (1.6 m)   Wt 160 lb (72.6 kg)   BMI 28.34 kg/m   General:  Well developed, well nourished, no acute distress Skin:  Warm and dry Neck:  Midline trachea, normal thyroid, good ROM, no lymphadenopathy,no carotid bruits heard  Lungs; Clear to auscultation bilaterally Breast:  No dominant palpable mass, retraction, or nipple discharge Cardiovascular: Regular rate and rhythm Abdomen:  Soft, non tender, no hepatosplenomegaly Pelvic:  External genitalia is normal in appearance, no lesions.  The vagina is pale. Urethra has no lesions or masses. The cervix and uterus are normal.  No adnexal masses or tenderness noted.Bladder is non tender, no masses felt. Rectal: Good sphincter tone, no polyps, or hemorrhoids felt.  Hemoccult negative. Extremities/musculoskeletal:  No swelling or varicosities noted, no clubbing or cyanosis Psych:  No mood changes, alert and cooperative,seems happy AA is 2 Fall risk is low    10/06/2023   11:23 AM 07/28/2023   10:55 AM 01/23/2023    9:34 AM  Depression screen PHQ 2/9  Decreased Interest 0 0 0  Down, Depressed, Hopeless 0 0 0  PHQ - 2 Score 0 0 0  Altered sleeping 1 1 1   Tired, decreased energy 0 1 0  Change in  appetite 0 1 0  Feeling bad or failure about yourself  0 1 0  Trouble concentrating 0 1 0  Moving slowly or fidgety/restless 0 0 0  Suicidal thoughts 0 0 0  PHQ-9 Score 1 5 1   Difficult doing work/chores  Somewhat difficult Not difficult at all       10/06/2023   11:27 AM 07/28/2023   10:55 AM 01/23/2023    9:35 AM 09/30/2022    9:39 AM  GAD 7 : Generalized Anxiety Score  Nervous, Anxious, on Edge 1 1 2  0  Control/stop worrying 1 1 1 1   Worry too much - different things 1 1 1 1   Trouble relaxing 1 1 1 1   Restless 0 0 0 0  Easily annoyed or irritable 0 0 0 0  Afraid - awful might happen 1 1 1 1   Total GAD 7 Score 5 5 6 4   Anxiety Difficulty   Not difficult at all Not difficult at all    Upstream - 10/06/23 1128       Pregnancy Intention Screening   Does the patient want to become pregnant in the next year? N/A    Does the patient's partner want to become pregnant in the next year? N/A    Would the patient like to discuss contraceptive options today? N/A      Contraception Wrap Up   Current Method --   hyst   End Method --   hyst   Contraception Counseling Provided  No              Examination chaperoned by Faith Rogue LPN   Impression and Plan: 1. Encounter for well woman exam with routine gynecological exam Physical in 2 years  Labs with PCP Colonoscopy next week Mammogram 04/09/23, is negative  Dexa was normal 2023  2. Encounter for screening fecal occult blood testing Hemoccult was negative  - POCT occult blood stool  3. S/P hysterectomy

## 2023-10-07 DIAGNOSIS — H20041 Secondary noninfectious iridocyclitis, right eye: Secondary | ICD-10-CM | POA: Diagnosis not present

## 2023-10-10 ENCOUNTER — Telehealth: Payer: Self-pay | Admitting: Family Medicine

## 2023-10-10 NOTE — Telephone Encounter (Signed)
Left message to return call 

## 2023-10-10 NOTE — Telephone Encounter (Signed)
Patient has fills current on all her meds- Will have to check with pharmacy to see if it is time to refill meds (insurance will not allow meds to be filled before they are due)

## 2023-10-10 NOTE — Telephone Encounter (Signed)
Pt Is changing her insurance she wanted to know if she can get meds refilled before her insurance changes over to [employer]

## 2023-10-13 DIAGNOSIS — S0501XA Injury of conjunctiva and corneal abrasion without foreign body, right eye, initial encounter: Secondary | ICD-10-CM | POA: Diagnosis not present

## 2023-10-13 DIAGNOSIS — H43811 Vitreous degeneration, right eye: Secondary | ICD-10-CM | POA: Diagnosis not present

## 2023-10-13 DIAGNOSIS — H20041 Secondary noninfectious iridocyclitis, right eye: Secondary | ICD-10-CM | POA: Diagnosis not present

## 2023-10-14 ENCOUNTER — Encounter (HOSPITAL_COMMUNITY)
Admission: RE | Disposition: A | Discharge status: Home / Self Care | Payer: Self-pay | Source: Home / Self Care | Attending: Internal Medicine

## 2023-10-14 ENCOUNTER — Other Ambulatory Visit: Payer: Self-pay

## 2023-10-14 ENCOUNTER — Ambulatory Visit (HOSPITAL_COMMUNITY)
Admission: RE | Admit: 2023-10-14 | Discharge: 2023-10-14 | Disposition: A | Discharge status: Home / Self Care | Payer: BC Managed Care – PPO | Attending: Internal Medicine | Admitting: Internal Medicine

## 2023-10-14 ENCOUNTER — Encounter (HOSPITAL_COMMUNITY): Payer: Self-pay

## 2023-10-14 ENCOUNTER — Ambulatory Visit (HOSPITAL_COMMUNITY): Payer: BC Managed Care – PPO | Admitting: Anesthesiology

## 2023-10-14 DIAGNOSIS — Z981 Arthrodesis status: Secondary | ICD-10-CM | POA: Diagnosis not present

## 2023-10-14 DIAGNOSIS — Z87891 Personal history of nicotine dependence: Secondary | ICD-10-CM | POA: Diagnosis not present

## 2023-10-14 DIAGNOSIS — F419 Anxiety disorder, unspecified: Secondary | ICD-10-CM | POA: Diagnosis not present

## 2023-10-14 DIAGNOSIS — K648 Other hemorrhoids: Secondary | ICD-10-CM | POA: Diagnosis not present

## 2023-10-14 DIAGNOSIS — K635 Polyp of colon: Secondary | ICD-10-CM | POA: Insufficient documentation

## 2023-10-14 DIAGNOSIS — Z8249 Family history of ischemic heart disease and other diseases of the circulatory system: Secondary | ICD-10-CM | POA: Diagnosis not present

## 2023-10-14 DIAGNOSIS — I10 Essential (primary) hypertension: Secondary | ICD-10-CM | POA: Insufficient documentation

## 2023-10-14 DIAGNOSIS — M1990 Unspecified osteoarthritis, unspecified site: Secondary | ICD-10-CM | POA: Diagnosis not present

## 2023-10-14 DIAGNOSIS — G709 Myoneural disorder, unspecified: Secondary | ICD-10-CM | POA: Diagnosis not present

## 2023-10-14 DIAGNOSIS — Z79899 Other long term (current) drug therapy: Secondary | ICD-10-CM | POA: Insufficient documentation

## 2023-10-14 DIAGNOSIS — Z1211 Encounter for screening for malignant neoplasm of colon: Secondary | ICD-10-CM | POA: Insufficient documentation

## 2023-10-14 DIAGNOSIS — K5730 Diverticulosis of large intestine without perforation or abscess without bleeding: Secondary | ICD-10-CM | POA: Diagnosis not present

## 2023-10-14 DIAGNOSIS — Z860101 Personal history of adenomatous and serrated colon polyps: Secondary | ICD-10-CM | POA: Diagnosis not present

## 2023-10-14 HISTORY — PX: COLONOSCOPY WITH PROPOFOL: SHX5780

## 2023-10-14 HISTORY — PX: POLYPECTOMY: SHX149

## 2023-10-14 SURGERY — COLONOSCOPY WITH PROPOFOL
Anesthesia: General

## 2023-10-14 MED ORDER — PROPOFOL 10 MG/ML IV BOLUS
INTRAVENOUS | Status: DC | PRN
  Administered 2023-10-14: 50 mg via INTRAVENOUS

## 2023-10-14 MED ORDER — GLYCOPYRROLATE PF 0.2 MG/ML IJ SOSY
PREFILLED_SYRINGE | INTRAMUSCULAR | Status: AC
  Filled 2023-10-14: qty 1

## 2023-10-14 MED ORDER — LACTATED RINGERS IV SOLN: INTRAVENOUS | Status: DC | PRN

## 2023-10-14 MED ORDER — EPHEDRINE SULFATE-NACL 50-0.9 MG/10ML-% IV SOSY
PREFILLED_SYRINGE | INTRAVENOUS | Status: DC | PRN
  Administered 2023-10-14: 10 mg via INTRAVENOUS

## 2023-10-14 MED ORDER — GLYCOPYRROLATE PF 0.2 MG/ML IJ SOSY
PREFILLED_SYRINGE | INTRAMUSCULAR | Status: DC | PRN
  Administered 2023-10-14: .2 mg via INTRAVENOUS

## 2023-10-14 MED ORDER — LIDOCAINE HCL (PF) 2 % IJ SOLN
INTRAMUSCULAR | Status: AC
  Filled 2023-10-14: qty 5

## 2023-10-14 MED ORDER — PROPOFOL 500 MG/50ML IV EMUL
INTRAVENOUS | Status: DC | PRN
  Administered 2023-10-14: 125 ug/kg/min via INTRAVENOUS

## 2023-10-14 MED ORDER — LIDOCAINE HCL (PF) 2 % IJ SOLN
INTRAMUSCULAR | Status: DC | PRN
  Administered 2023-10-14: 100 mg via INTRADERMAL

## 2023-10-14 MED ORDER — PROPOFOL 500 MG/50ML IV EMUL
INTRAVENOUS | Status: AC
  Filled 2023-10-14: qty 50

## 2023-10-14 MED ORDER — LIDOCAINE 2% (20 MG/ML) 5 ML SYRINGE: INTRAMUSCULAR | Status: DC | PRN

## 2023-10-14 NOTE — Op Note (Signed)
[facility] Patient Name: Daisy Edwards Procedure Date: 10/14/2023 8:46 AM MRN: [id number] Date of Birth: 05-01-1956 Attending MD: Hennie Duos. Marletta Lor , [place], [id number] CSN: [id number] Age: 67 Admit Type: Outpatient Procedure:                Colonoscopy Indications:              Surveillance: Personal history of adenomatous                            polyps on last colonoscopy > 5 years ago Providers:                Hennie Duos. Marletta Lor, DO, Crystal Page, Lennice Sites                            Technician, Technician Referring MD:              Medicines:                See the Anesthesia note for documentation of the                            administered medications Complications:            No immediate complications. Estimated Blood Loss:     Estimated blood loss was minimal. Procedure:                Pre-Anesthesia Assessment:                           - The anesthesia plan was to use monitored                            anesthesia care (MAC).                           After obtaining informed consent, the colonoscope                            was passed under direct vision. Throughout the                            procedure, the patient's blood pressure, pulse, and                            oxygen saturations were monitored continuously. The                            [id number]-HQ190L ([id number]) scope was introduced through                            the anus and advanced to the the cecum, identified                            by appendiceal orifice and ileocecal valve. The                            colonoscopy was performed  without difficulty. The                            patient tolerated the procedure well. The quality                            of the bowel preparation was evaluated using the                            BBPS [facility] Bowel Preparation Scale) with scores                            of: Right Colon = 3, Transverse Colon = 3 and Left                            Colon  = 3 (entire mucosa seen well with no residual                            staining, small fragments of stool or opaque                            liquid). The total BBPS score equals 9. Scope In: 8:58:08 AM Scope Out: 9:17:27 AM Scope Withdrawal Time: 0 hours 14 minutes 17 seconds  Total Procedure Duration: 0 hours 19 minutes 19 seconds  Findings:      Non-bleeding internal hemorrhoids were found during endoscopy.      Two sessile polyps were found in the sigmoid colon. The polyps were 3 to       4 mm in size. These polyps were removed with a cold snare. Resection and       retrieval were complete.      A few small-mouthed diverticula were found in the sigmoid colon and       descending colon.      The exam was otherwise without abnormality. Impression:               - Non-bleeding internal hemorrhoids.                           - Two 3 to 4 mm polyps in the sigmoid colon,                            removed with a cold snare. Resected and retrieved.                           - Diverticulosis in the sigmoid colon and in the                            descending colon.                           - The examination was otherwise normal. Moderate Sedation:      Per Anesthesia Care Recommendation:           - Patient has a contact number available for  emergencies. The signs and symptoms of potential                            delayed complications were discussed with the                            patient. Return to normal activities tomorrow.                            Written discharge instructions were provided to the                            patient.                           - Resume previous diet.                           - Continue present medications.                           - Await pathology results.                           - Repeat colonoscopy in 7-10 years for surveillance.                           - Return to GI clinic PRN. Procedure Code(s):         --- Professional ---                           [phone], Colonoscopy, flexible; with removal of                            tumor(s), polyp(s), or other lesion(s) by snare                            technique Diagnosis Code(s):        --- Professional ---                           Z86.010, Personal history of colonic polyps                           D12.5, Benign neoplasm of sigmoid colon                           K64.8, Other hemorrhoids                           K57.30, Diverticulosis of large intestine without                            perforation or abscess without bleeding CPT copyright 2022 American Medical Association. All rights reserved. The codes documented in this report are preliminary and upon coder review may  be revised to meet current compliance requirements. Hennie Duos. Marletta Lor,  DO Hennie Duos. Marletta Lor, DO 10/14/2023 9:22:58 AM This report has been signed electronically. Number of Addenda: 0

## 2023-10-14 NOTE — Anesthesia Preprocedure Evaluation (Signed)
Anesthesia Evaluation  Patient identified by MRN, date of birth, ID band Patient awake    Reviewed: Allergy & Precautions, H&P , NPO status , Patient's Chart, lab work & pertinent test results  History of Anesthesia Complications Negative for: history of anesthetic complications  Airway Mallampati: I  TM Distance: >3 FB Neck ROM: full    Dental no notable dental hx. (+) Teeth Intact, Dental Advisory Given   Pulmonary neg pulmonary ROS, former smoker          Cardiovascular Exercise Tolerance: Good hypertension, Pt. on medications + angina  Normal cardiovascular exam Rhythm:regular Rate:Normal  Microvascular angina   Neuro/Psych  PSYCHIATRIC DISORDERS Anxiety     Cervical fusion  Neuromuscular disease (neck pain; numbness and weakness BUE)    GI/Hepatic negative GI ROS, Neg liver ROS,,,  Endo/Other  negative endocrine ROS    Renal/GU negative Renal ROS  negative genitourinary   Musculoskeletal  (+) Arthritis , Osteoarthritis,    Abdominal  (+) + obese  Peds  Hematology  (+) Blood dyscrasia   Anesthesia Other Findings   Reproductive/Obstetrics negative OB ROS                             Anesthesia Physical Anesthesia Plan  ASA: 3  Anesthesia Plan: General ETT and General   Post-op Pain Management: Minimal or no pain anticipated   Induction: Intravenous  PONV Risk Score and Plan:   Airway Management Planned: Nasal Cannula and Natural Airway  Additional Equipment: None  Intra-op Plan:   Post-operative Plan:   Informed Consent: I have reviewed the patients History and Physical, chart, labs and discussed the procedure including the risks, benefits and alternatives for the proposed anesthesia with the patient or authorized representative who has indicated his/her understanding and acceptance.     Dental advisory given  Plan Discussed with: CRNA  Anesthesia Plan Comments:          Anesthesia Quick Evaluation

## 2023-10-14 NOTE — H&P (Signed)
Primary Care Physician:  Tommie Sams, DO Primary Gastroenterologist:  Dr. Marletta Lor  Pre-Procedure History & Physical: HPI:  Daisy Edwards is a 67 y.o. female is here for a colonoscopy to be performed for surveillance purposes, personal history of adenomatous colon polyps in 2018  Past Medical History:  Diagnosis Date   Anemia    Anxiety    Panic attack in past   Arthritis    Cervical stenosis, spondylosis    Back pain, chronic    Carpal tunnel syndrome on both sides    Right > left   Cervical vertebral fusion    Chronic leg pain    Elevated cholesterol    Essential hypertension    Hyperlipidemia    Impaired fasting glucose    Microvascular angina [facility])     Past Surgical History:  Procedure Laterality Date   ABDOMINAL HYSTERECTOMY     ANTERIOR CERVICAL DECOMP/DISCECTOMY FUSION  01/29/2012   Procedure: ANTERIOR CERVICAL DECOMPRESSION/DISCECTOMY FUSION 2 LEVELS;  Surgeon: Eldred Manges, MD;  Location: [facility] OR;  Service: Orthopedics;  Laterality: N/A;  C3-4, C4-5 Anterior Cervical Discectomy and Fusion, allograft, plate   CARDIAC CATHETERIZATION     prior to 2008, not sure where it was done, result- wnl    CHOLECYSTECTOMY     [facility]   COLONOSCOPY     COLONOSCOPY N/A 10/06/2017   Procedure: COLONOSCOPY;  Surgeon: West Bali, MD;  Location: [facility] ENDO SUITE;  Service: Endoscopy;  Laterality: N/A;  8:30   POLYPECTOMY  10/06/2017   Procedure: POLYPECTOMY;  Surgeon: West Bali, MD;  Location: [facility] ENDO SUITE;  Service: Endoscopy;;  colon   VEIN SURGERY      Prior to Admission medications   Medication Sig Start Date End Date Taking? Authorizing Provider  amLODipine (NORVASC) 5 MG tablet Take 1 tablet (5 mg total) by mouth at bedtime. 01/23/23  Yes Cook, Jayce G, DO  aspirin 81 MG tablet Take 81 mg by mouth every other day.   Yes [provider]  Cholecalciferol 50 MCG (2000 UT) CAPS Take by mouth.   Yes [provider]  lisinopril-hydrochlorothiazide  (ZESTORETIC) 20-25 MG tablet Take 1 tablet by mouth daily. 01/23/23  Yes Cook, Verdis Frederickson, DO  Multiple Vitamin (MULITIVITAMIN WITH MINERALS) TABS Take 1 tablet by mouth daily.   Yes [provider]  Omega-3 Fatty Acids (FISH OIL TRIPLE STRENGTH PO) Take 1,500 mg by mouth daily.    Yes [provider]  ranolazine (RANEXA) 500 MG 12 hr tablet Take 1 tablet by mouth twice daily 09/04/23  Yes Jonelle Sidle, MD  rosuvastatin (CRESTOR) 40 MG tablet Take 1 tablet (40 mg total) by mouth daily. 08/05/23  Yes Cook, Jayce G, DO  TURMERIC PO Take 1 capsule by mouth daily as needed (joint pain).   Yes [provider]  Na Sulfate-K Sulfate-Mg Sulf 17.5-3.13-1.6 GM/177ML SOLN As directed Patient not taking: Reported on 10/06/2023 09/02/23   Lanelle Bal, DO  triamcinolone ointment (KENALOG) 0.5 % Apply 1 Application topically daily as needed. Patient not taking: Reported on 10/06/2023 01/23/23   Tommie Sams, DO    Allergies as of 09/02/2023 - Review Complete 08/21/2023  Allergen Reaction Noted   Vitamin c Itching 06/15/2021    Family History  Problem Relation Age of Onset   Heart disease Father        MI   Heart attack Father    Hypertension Mother    Aneurysm Mother  brain   Cancer Maternal Aunt        breast   Diabetes Maternal Aunt    Cancer Maternal Grandmother        colon    Diabetes Maternal Aunt    SIDS Son    Anesthesia problems Neg Hx     Social History   Socioeconomic History   Marital status: Married    Spouse name: Not on file   Number of children: 0   Years of education: Not on file   Highest education level: Not on file  Occupational History    Employer: [employer]  Tobacco Use   Smoking status: Former    Types: Cigarettes   Smokeless tobacco: Never   Tobacco comments:    quit 30 years ago.   Vaping Use   Vaping status: Never Used  Substance and Sexual Activity   Alcohol use: Not Currently    Comment: wine, socially     Drug use: No   Sexual activity: Yes    Birth control/protection: Surgical    Comment: hyst  Other Topics Concern   Not on file  Social History Narrative   Not on file   Social Determinants of Health   Financial Resource Strain: Low Risk  (10/06/2023)   Overall Financial Resource Strain (CARDIA)    Difficulty of Paying Living Expenses: Not hard at all  Food Insecurity: No Food Insecurity (10/06/2023)   Hunger Vital Sign    Worried About Running Out of Food in the Last Year: Never true    Ran Out of Food in the Last Year: Never true  Transportation Needs: No Transportation Needs (10/06/2023)   PRAPARE - Administrator, Civil Service (Medical): No    Lack of Transportation (Non-Medical): No  Physical Activity: Sufficiently Active (10/06/2023)   Exercise Vital Sign    Days of Exercise per Week: 3 days    Minutes of Exercise per Session: 60 min  Stress: Stress Concern Present (10/06/2023)   [employer] of Occupational Health - Occupational Stress Questionnaire    Feeling of Stress : To some extent  Social Connections: Socially Integrated (10/06/2023)   Social Connection and Isolation Panel [NHANES]    Frequency of Communication with Friends and Family: More than three times a week    Frequency of Social Gatherings with Friends and Family: Once a week    Attends Religious Services: More than 4 times per year    Active Member of Clubs or Organizations: Yes    Attends Banker Meetings: 1 to 4 times per year    Marital Status: Married  Catering manager Violence: Not At Risk (10/06/2023)   Humiliation, Afraid, Rape, and Kick questionnaire    Fear of Current or Ex-Partner: No    Emotionally Abused: No    Physically Abused: No    Sexually Abused: No    Review of Systems: See HPI, otherwise negative ROS  Physical Exam: Vital signs in last 24 hours: Temp:  [98.3 F (36.8 C)] 98.3 F (36.8 C) (11/26 0820) Pulse Rate:  [76] 76 (11/26 0820) Resp:   [16] 16 (11/26 0820) BP: (140)/(64) 140/64 (11/26 0820) SpO2:  [97 %] 97 % (11/26 0820) Weight:  [74.8 kg] 74.8 kg (11/26 0812)   General:   Alert,  Well-developed, well-nourished, pleasant and cooperative in NAD Head:  Normocephalic and atraumatic. Eyes:  Sclera clear, no icterus.   Conjunctiva pink. Ears:  Normal auditory acuity. Nose:  No deformity, discharge,  or lesions.  Msk:  Symmetrical without gross deformities. Normal posture. Extremities:  Without clubbing or edema. Neurologic:  Alert and  oriented x4;  grossly normal neurologically. Skin:  Intact without significant lesions or rashes. Psych:  Alert and cooperative. Normal mood and affect.  Impression/Plan: Daisy Edwards is here for a colonoscopy to be performed for surveillance purposes, personal history of adenomatous colon polyps in 2018  The risks of the procedure including infection, bleed, or perforation as well as benefits, limitations, alternatives and imponderables have been reviewed with the patient. Questions have been answered. All parties agreeable.

## 2023-10-14 NOTE — Transfer of Care (Signed)
Immediate Anesthesia Transfer of Care Note  Patient: Daisy Edwards  Procedure(s) Performed: COLONOSCOPY WITH PROPOFOL POLYPECTOMY INTESTINAL  Patient Location: Endoscopy Unit  Anesthesia Type:General  Level of Consciousness: drowsy  Airway & Oxygen Therapy: Patient Spontanous Breathing and Patient connected to face mask oxygen  Post-op Assessment: Report given to RN and Post -op Vital signs reviewed and stable  Post vital signs: Reviewed and stable  Last Vitals:  Vitals Value Taken Time  BP    Temp    Pulse    Resp    SpO2      Last Pain:  Vitals:   10/14/23 0854  TempSrc:   PainSc: 0-No pain      Patients Stated Pain Goal: 7 (10/14/23 1610)  Complications: No notable events documented.

## 2023-10-14 NOTE — Anesthesia Postprocedure Evaluation (Signed)
Anesthesia Post Note  Patient: Daisy Edwards  Procedure(s) Performed: COLONOSCOPY WITH PROPOFOL POLYPECTOMY INTESTINAL  Patient location during evaluation: PACU Anesthesia Type: General Level of consciousness: awake and alert Pain management: pain level controlled Vital Signs Assessment: post-procedure vital signs reviewed and stable Respiratory status: spontaneous breathing, nonlabored ventilation, respiratory function stable and patient connected to nasal cannula oxygen Cardiovascular status: blood pressure returned to baseline and stable Postop Assessment: no apparent nausea or vomiting Anesthetic complications: no   There were no known notable events for this encounter.   Last Vitals:  Vitals:   10/14/23 0820 10/14/23 0923  BP: (!) 140/64 (!) 94/52  Pulse: 76 88  Resp: 16 16  Temp: 36.8 C 36.9 C  SpO2: 97% 98%    Last Pain:  Vitals:   10/14/23 0923  TempSrc: Oral  PainSc: 0-No pain                 Daisy Edwards

## 2023-10-14 NOTE — Discharge Instructions (Addendum)
  Colonoscopy Discharge Instructions  Read the instructions outlined below and refer to this sheet in the next few weeks. These discharge instructions provide you with general information on caring for yourself after you leave the hospital. Your doctor may also give you specific instructions. While your treatment has been planned according to the most current medical practices available, unavoidable complications occasionally occur.   ACTIVITY You may resume your regular activity, but move at a slower pace for the next 24 hours.  Take frequent rest periods for the next 24 hours.  Walking will help get rid of the air and reduce the bloated feeling in your belly (abdomen).  No driving for 24 hours (because of the medicine (anesthesia) used during the test).   Do not sign any important legal documents or operate any machinery for 24 hours (because of the anesthesia used during the test).  NUTRITION Drink plenty of fluids.  You may resume your normal diet as instructed by your doctor.  Begin with a light meal and progress to your normal diet. Heavy or fried foods are harder to digest and may make you feel sick to your stomach (nauseated).  Avoid alcoholic beverages for 24 hours or as instructed.  MEDICATIONS You may resume your normal medications unless your doctor tells you otherwise.  WHAT YOU CAN EXPECT TODAY Some feelings of bloating in the abdomen.  Passage of more gas than usual.  Spotting of blood in your stool or on the toilet paper.  IF YOU HAD POLYPS REMOVED DURING THE COLONOSCOPY: No aspirin products for 7 days or as instructed.  No alcohol for 7 days or as instructed.  Eat a soft diet for the next 24 hours.  FINDING OUT THE RESULTS OF YOUR TEST Not all test results are available during your visit. If your test results are not back during the visit, make an appointment with your caregiver to find out the results. Do not assume everything is normal if you have not heard from your  caregiver or the medical facility. It is important for you to follow up on all of your test results.  SEEK IMMEDIATE MEDICAL ATTENTION IF: You have more than a spotting of blood in your stool.  Your belly is swollen (abdominal distention).  You are nauseated or vomiting.  You have a temperature over 101.  You have abdominal pain or discomfort that is severe or gets worse throughout the day.   Your colonoscopy revealed 2 polyp(s) which I removed successfully. Await pathology results, my office will contact you. I recommend repeating colonoscopy in 7-10 years for surveillance purposes. Otherwise follow up with GI as needed   I hope you have a great rest of your week!  Hennie Duos. Marletta Lor, D.O. Gastroenterology and Hepatology [facility]

## 2023-10-17 LAB — SURGICAL PATHOLOGY

## 2023-10-22 ENCOUNTER — Encounter (HOSPITAL_COMMUNITY): Payer: Self-pay | Admitting: Internal Medicine

## 2024-01-26 ENCOUNTER — Encounter: Payer: Self-pay | Admitting: Family Medicine

## 2024-01-26 ENCOUNTER — Ambulatory Visit (INDEPENDENT_AMBULATORY_CARE_PROVIDER_SITE_OTHER): Payer: BC Managed Care – PPO | Admitting: Family Medicine

## 2024-01-26 VITALS — BP 124/81 | HR 67 | Temp 98.0°F | Ht 63.0 in | Wt 167.0 lb

## 2024-01-26 DIAGNOSIS — I10 Essential (primary) hypertension: Secondary | ICD-10-CM | POA: Diagnosis not present

## 2024-01-26 DIAGNOSIS — E785 Hyperlipidemia, unspecified: Secondary | ICD-10-CM | POA: Diagnosis not present

## 2024-01-26 DIAGNOSIS — R7303 Prediabetes: Secondary | ICD-10-CM

## 2024-01-26 MED ORDER — LISINOPRIL-HYDROCHLOROTHIAZIDE 20-25 MG PO TABS: 1.0000 | ORAL_TABLET | Freq: Every day | ORAL | 3 refills | Status: DC

## 2024-01-26 MED ORDER — AMLODIPINE BESYLATE 5 MG PO TABS: 5.0000 mg | ORAL_TABLET | Freq: Every day | ORAL | 3 refills | Status: DC

## 2024-01-26 NOTE — Patient Instructions (Signed)
 Medications refilled.  Labs today.  Follow up in 6 months.

## 2024-01-27 NOTE — Assessment & Plan Note (Signed)
-

## 2024-01-27 NOTE — Assessment & Plan Note (Signed)
 Stable.  Continue amlodipine and lisinopril/HCTZ.  Medications were refilled today.

## 2024-01-27 NOTE — Progress Notes (Signed)
 Subjective:  Patient ID: Daisy Edwards, female    DOB: 1956/01/27  Age: 68 y.o. MRN: [id number]  CC:   Chief Complaint  Patient presents with   Hypertension    6 month f/u hypertension     HPI:  68 year old female presents for follow-up.  Patient states that she is doing well.  No chest pain or shortness of breath.  She states that she is feeling well.  She has no complaints or concerns at this time.  Hypertension stable on lisinopril/HCTZ and amlodipine.  Patient needs reassessment of lipids with lipid panel.  She is currently on Crestor 40 mg daily.  Last LDL was 105.  Patient Active Problem List   Diagnosis Date Noted   Microvascular angina (HCC) 01/23/2023   S/P hysterectomy 09/30/2022   Prediabetes 02/14/2018   Varicose veins of bilateral lower extremities with other complications 07/19/2014   Hyperlipidemia 03/15/2013   Cervical stenosis of spinal canal 01/29/2012    Class: Diagnosis of   Essential hypertension, benign 03/20/2009    Social Hx   Social History   Socioeconomic History   Marital status: Married    Spouse name: Not on file   Number of children: 0   Years of education: Not on file   Highest education level: Not on file  Occupational History    Employer: [employer]  Tobacco Use   Smoking status: Former    Types: Cigarettes   Smokeless tobacco: Never   Tobacco comments:    quit 30 years ago.   Vaping Use   Vaping status: Never Used  Substance and Sexual Activity   Alcohol use: Not Currently    Comment: wine, socially    Drug use: No   Sexual activity: Yes    Birth control/protection: Surgical    Comment: hyst  Other Topics Concern   Not on file  Social History Narrative   Not on file   Social Drivers of Health   Financial Resource Strain: Low Risk  (10/06/2023)   Overall Financial Resource Strain (CARDIA)    Difficulty of Paying Living Expenses: Not hard at all  Food Insecurity: No Food Insecurity (10/06/2023)   Hunger  Vital Sign    Worried About Running Out of Food in the Last Year: Never true    Ran Out of Food in the Last Year: Never true  Transportation Needs: No Transportation Needs (10/06/2023)   PRAPARE - Administrator, Civil Service (Medical): No    Lack of Transportation (Non-Medical): No  Physical Activity: Sufficiently Active (10/06/2023)   Exercise Vital Sign    Days of Exercise per Week: 3 days    Minutes of Exercise per Session: 60 min  Stress: Stress Concern Present (10/06/2023)   [employer] of Occupational Health - Occupational Stress Questionnaire    Feeling of Stress : To some extent  Social Connections: Socially Integrated (10/06/2023)   Social Connection and Isolation Panel [NHANES]    Frequency of Communication with Friends and Family: More than three times a week    Frequency of Social Gatherings with Friends and Family: Once a week    Attends Religious Services: More than 4 times per year    Active Member of [employer] or Organizations: Yes    Attends Banker Meetings: 1 to 4 times per year    Marital Status: Married    Review of Systems Per HPI  Objective:  BP 124/81   Pulse 67   Temp 98 F (36.7 C)  Ht 5\' 3"  (1.6 m)   Wt 167 lb (75.8 kg)   SpO2 98%   BMI 29.58 kg/m      01/26/2024   10:20 AM 10/14/2023    9:23 AM 10/14/2023    8:20 AM  BP/Weight  Systolic BP 124 94 140  Diastolic BP 81 52 64  Wt. (Lbs) 167    BMI 29.58 kg/m2      Physical Exam Vitals and nursing note reviewed.  Constitutional:      General: She is not in acute distress.    Appearance: Normal appearance.  HENT:     Head: Normocephalic and atraumatic.  Eyes:     General:        Right eye: No discharge.        Left eye: No discharge.     Conjunctiva/sclera: Conjunctivae normal.  Cardiovascular:     Rate and Rhythm: Normal rate and regular rhythm.  Pulmonary:     Effort: Pulmonary effort is normal.     Breath sounds: Normal breath sounds. No wheezing,  rhonchi or rales.  Neurological:     Mental Status: She is alert.  Psychiatric:        Mood and Affect: Mood normal.        Behavior: Behavior normal.     Lab Results  Component Value Date   WBC 3.7 08/01/2023   HGB 13.8 08/01/2023   HCT 42.2 08/01/2023   PLT 220 08/01/2023   GLUCOSE 129 (H) 10/06/2023   CHOL 187 08/01/2023   TRIG 71 08/01/2023   HDL 69 08/01/2023   LDLCALC 105 (H) 08/01/2023   ALT 18 08/01/2023   AST 18 08/01/2023   NA 137 10/06/2023   K 3.4 (L) 10/06/2023   CL 101 10/06/2023   CREATININE 0.88 10/06/2023   BUN 20 10/06/2023   CO2 27 10/06/2023   TSH 2.010 04/03/2015   INR 1.05 01/24/2012   HGBA1C 6.0 (H) 08/01/2023     Assessment & Plan:  Essential hypertension, benign Assessment & Plan: Stable.  Continue amlodipine and lisinopril/HCTZ.  Medications were refilled today.  Orders: -     amLODIPine Besylate; Take 1 tablet (5 mg total) by mouth at bedtime.  Dispense: 90 tablet; Refill: 3 -     Lisinopril-hydroCHLOROthiazide; Take 1 tablet by mouth daily.  Dispense: 90 tablet; Refill: 3 -     CMP14+EGFR -     Microalbumin / creatinine urine ratio  Prediabetes Assessment & Plan: A1c today.  Orders: -     CMP14+EGFR -     Hemoglobin A1c -     Microalbumin / creatinine urine ratio  Hyperlipidemia, unspecified hyperlipidemia type Assessment & Plan: Last LDL was not at goal.  Lipid panel today to assess.  Continue Crestor.  Orders: -     Lipid panel    Follow-up:  6 months.  Everlene Other DO [facility]

## 2024-01-27 NOTE — Assessment & Plan Note (Signed)
 Last LDL was not at goal.  Lipid panel today to assess.  Continue Crestor.

## 2024-01-29 LAB — CMP14+EGFR
ALT: 18 IU/L (ref 0–32)
AST: 19 IU/L (ref 0–40)
Albumin: 4.6 g/dL (ref 3.9–4.9)
Alkaline Phosphatase: 69 IU/L (ref 44–121)
BUN/Creatinine Ratio: 16 (ref 12–28)
BUN: 15 mg/dL (ref 8–27)
Bilirubin Total: 0.4 mg/dL (ref 0.0–1.2)
CO2: 24 mmol/L (ref 20–29)
Calcium: 9.8 mg/dL (ref 8.7–10.3)
Chloride: 102 mmol/L (ref 96–106)
Creatinine, Ser: 0.91 mg/dL (ref 0.57–1.00)
Globulin, Total: 2.5 g/dL (ref 1.5–4.5)
Glucose: 119 mg/dL — ABNORMAL HIGH (ref 70–99)
Potassium: 5 mmol/L (ref 3.5–5.2)
Sodium: 140 mmol/L (ref 134–144)
Total Protein: 7.1 g/dL (ref 6.0–8.5)
eGFR: 69 mL/min/{1.73_m2} (ref >59)

## 2024-01-29 LAB — LIPID PANEL
Chol/HDL Ratio: 2.2 ratio (ref 0.0–4.4)
Cholesterol, Total: 193 mg/dL (ref 100–199)
HDL: 87 mg/dL (ref >39)
LDL Chol Calc (NIH): 94 mg/dL (ref 0–99)
Triglycerides: 62 mg/dL (ref 0–149)
VLDL Cholesterol Cal: 12 mg/dL (ref 5–40)

## 2024-01-29 LAB — HEMOGLOBIN A1C
Est. average glucose Bld gHb Est-mCnc: 120 mg/dL
Hgb A1c MFr Bld: 5.8 % — ABNORMAL HIGH (ref 4.8–5.6)

## 2024-01-29 LAB — MICROALBUMIN / CREATININE URINE RATIO
Creatinine, Urine: 109.9 mg/dL
Microalb/Creat Ratio: 22 mg/g{creat} (ref 0–29)
Microalbumin, Urine: 24.1 ug/mL

## 2024-02-05 ENCOUNTER — Other Ambulatory Visit: Payer: Self-pay | Admitting: Family Medicine

## 2024-02-05 MED ORDER — EZETIMIBE 10 MG PO TABS: 10.0000 mg | ORAL_TABLET | Freq: Every day | ORAL | 3 refills | Status: DC

## 2024-02-18 ENCOUNTER — Ambulatory Visit: Admitting: Physician Assistant

## 2024-02-18 ENCOUNTER — Encounter: Payer: Self-pay | Admitting: Physician Assistant

## 2024-02-18 VITALS — BP 115/73 | HR 99 | Temp 102.1°F | Ht 63.0 in | Wt 163.2 lb

## 2024-02-18 DIAGNOSIS — J0140 Acute pansinusitis, unspecified: Secondary | ICD-10-CM | POA: Diagnosis not present

## 2024-02-18 DIAGNOSIS — R051 Acute cough: Secondary | ICD-10-CM | POA: Diagnosis not present

## 2024-02-18 MED ORDER — DOXYCYCLINE HYCLATE 100 MG PO TABS: 100.0000 mg | ORAL_TABLET | Freq: Two times a day (BID) | ORAL | 0 refills | Status: AC

## 2024-02-18 MED ORDER — PROMETHAZINE-DM 6.25-15 MG/5ML PO SYRP: 5.0000 mL | ORAL_SOLUTION | Freq: Four times a day (QID) | ORAL | 0 refills | Status: DC | PRN

## 2024-02-18 NOTE — Progress Notes (Signed)
 Acute Office Visit  Subjective:     Patient ID: Daisy Edwards, female    DOB: 03-07-1956, 68 y.o.   MRN: [id number]   Patient presents today with complaints of congestion, headache, and bodyaches.  She reports symptoms began last week.  Associated symptoms include fever, facial pain, cough, body aches, and patient complains of teeth pain.  She reports fever beginning on Sunday and coming and going over the week.  She denies sick contacts.  She reports eating and drinking okay.  She has been taking Zyrtec at home for symptoms.  She denies chest pain or shortness of breath today.     Review of Systems  Constitutional:  Positive for chills, fever and malaise/fatigue.  HENT:  Positive for congestion and sinus pain. Negative for ear pain.   Respiratory:  Positive for cough.   Cardiovascular:  Negative for chest pain.  Musculoskeletal:  Positive for myalgias.  Neurological:  Positive for headaches.        Objective:     BP 115/73   Pulse 99   Temp (!) 102.1 F (38.9 C)   Ht 5\' 3"  (1.6 m)   Wt 163 lb 3.2 oz (74 kg)   SpO2 95%   BMI 28.91 kg/m   Physical Exam Vitals reviewed.  Constitutional:      Appearance: Normal appearance. She is ill-appearing.  HENT:     Right Ear: Tympanic membrane normal.     Left Ear: Tympanic membrane normal.     Nose: Congestion and rhinorrhea present.     Right Sinus: Maxillary sinus tenderness and frontal sinus tenderness present.     Left Sinus: Maxillary sinus tenderness and frontal sinus tenderness present.     Mouth/Throat:     Mouth: Mucous membranes are moist.     Pharynx: Oropharynx is clear.  Eyes:     Extraocular Movements: Extraocular movements intact.     Conjunctiva/sclera: Conjunctivae normal.  Cardiovascular:     Rate and Rhythm: Normal rate and regular rhythm.     Heart sounds: Normal heart sounds. No murmur heard. Pulmonary:     Effort: Pulmonary effort is normal.     Breath sounds: Normal breath sounds. No wheezing,  rhonchi or rales.  Lymphadenopathy:     Cervical: No cervical adenopathy.  Skin:    General: Skin is warm and dry.  Neurological:     General: No focal deficit present.     Mental Status: She is alert and oriented to person, place, and time.  Psychiatric:        Mood and Affect: Mood normal.        Behavior: Behavior normal.     No results found for any visits on 02/18/24.      Assessment & Plan:  Acute non-recurrent pansinusitis -     Doxycycline Hyclate; Take 1 tablet (100 mg total) by mouth 2 (two) times daily for 7 days.  Dispense: 14 tablet; Refill: 0  Acute cough -     Promethazine-DM; Take 5 mLs by mouth 4 (four) times daily as needed for cough.  Dispense: 118 mL; Refill: 0   Presentation was consistent with sinusitis.  No evidence of other bacterial infections including pneumonia, pharyngitis, otitis media, or orbital cellulitis. Discussed that this fits the picture of viral vs bacterial sinusitis and that due to type and duration of symptoms and exam findings, we will treat as bacterial sinusitis.  Antibiotics prescribed. Advised Tylenol at home for fevers or pain. Promethazine DM for cough.  Lungs clear to auscultation bilaterally no indication for chest x-ray at this time. Patient should continue zyrtec daily for allergy symptoms. The patient was instructed to return if the worsens in any way, especially if not tolerating fluids, increased sinus pain or swelling, worsening headache, persistent fever, difficulty swallowing or breathing, or as needed. The patient agreed with the plan.    Return if symptoms worsen or fail to improve.  Toni Amend Dearion Huot, PA-C

## 2024-04-16 ENCOUNTER — Ambulatory Visit: Payer: Self-pay

## 2024-04-16 NOTE — Telephone Encounter (Signed)
  Chief Complaint: rash  Symptoms: itches   Disposition: [] ED /[] Urgent Care (no appt availability in office) / [x] Appointment(In office/virtual)/ []  [place] [facility]/ [] Home Care/ [] Refused Recommended Disposition /[] [place] [facility]/ []  Follow-up with PCP Additional Notes: Pt calling with rash that stared out on hands last week,  but has now traveled to neck and face. Original spot was "blister like" but rest are little red spots.  The blister was popped. Pt is using abx cream. For itching pt is taking benadryl and gets relief. Pt denies any change in hygiene/detergents. Pt has soonest appt on 6/2 @ 1040. RN gave care advice and pt verbalized understanding. RN advised to monitor for  signs of infection and seek care at [facility] over weekend if symptoms worsen. Pt verbalized understanding.              Copied from CRM [id number]. Topic: Clinical - Red Word Triage >> Apr 16, 2024  9:30 AM Donald Frost wrote: Red Word that prompted transfer to Nurse Triage: The patent called in stating she has a rash on her hands that started off looking like a bite but has spread to her neck and lower part of her face. With that I will transfer her to E2C2 NT Reason for Disposition  Mild widespread rash  (Exception: Heat rash lasting 3 days or less.)  Answer Assessment - Initial Assessment Questions 1. APPEARANCE of RASH: "Describe the rash." (e.g., spots, blisters, raised areas, skin peeling, scaly)     Raised/blisters  2. SIZE: "How big are the spots?" (e.g., tip of pen, eraser, coin; inches, centimeters)     Pencil erasers to pin point  3. LOCATION: "Where is the rash located?"     Hands, neck, face 4. COLOR: "What color is the rash?" (Note: It is difficult to assess rash color in people with darker-colored skin. When this situation occurs, simply ask the caller to describe what they see.)     White-water  blister 5. ONSET: "When did the rash begin?"     Last week  6. FEVER: "Do you have a fever?"  If Yes, ask: "What is your temperature, how was it measured, and when did it start?"     Denies  7. ITCHING: "Does the rash itch?" If Yes, ask: "How bad is the itch?" (Scale 1-10; or mild, moderate, severe)     9 if no medication 8. CAUSE: "What do you think is causing the rash?"     Not sure  9. MEDICINE FACTORS: "Have you started any new medicines within the last 2 weeks?" (e.g., antibiotics)      Denies  10. OTHER SYMPTOMS: "Do you have any other symptoms?" (e.g., dizziness, headache, sore throat, joint pain)       Denies  Protocols used: Rash or Redness - [facility]

## 2024-04-19 ENCOUNTER — Ambulatory Visit: Admitting: Family Medicine

## 2024-04-19 ENCOUNTER — Encounter: Payer: Self-pay | Admitting: Family Medicine

## 2024-04-19 VITALS — BP 138/82 | HR 71 | Temp 97.7°F | Ht 63.0 in | Wt 168.0 lb

## 2024-04-19 DIAGNOSIS — R21 Rash and other nonspecific skin eruption: Secondary | ICD-10-CM | POA: Diagnosis not present

## 2024-04-19 NOTE — Progress Notes (Signed)
 Subjective:  Patient ID: Daisy Edwards, female    DOB: 1956-06-12  Age: 68 y.o. MRN: [id number]  CC:   Chief Complaint  Patient presents with   follow up for rash     X 2 weeks located on left arm, neck, face- went to urgent care and was prescribed 2 rx- had blisters, itching and body aches    HPI:  68 year old female presents for evaluation of the above.  Patient developed a rash on the right side of the neck and the left arm.  Occurred after she was working outside.  She developed blisters.  She reports that she went to urgent care on Friday and was given corticosteroids and has had a significant improvement.  He presents today for reevaluation.  Patient Active Problem List   Diagnosis Date Noted   Rash 04/19/2024   Microvascular angina (HCC) 01/23/2023   S/P hysterectomy 09/30/2022   Prediabetes 02/14/2018   Varicose veins of bilateral lower extremities with other complications 07/19/2014   Hyperlipidemia 03/15/2013   Cervical stenosis of spinal canal 01/29/2012    Class: Diagnosis of   Essential hypertension, benign 03/20/2009    Social Hx   Social History   Socioeconomic History   Marital status: Married    Spouse name: Not on file   Number of children: 0   Years of education: Not on file   Highest education level: Not on file  Occupational History    Employer: [employer]  Tobacco Use   Smoking status: Former    Types: Cigarettes   Smokeless tobacco: Never   Tobacco comments:    quit 30 years ago.   Vaping Use   Vaping status: Never Used  Substance and Sexual Activity   Alcohol use: Not Currently    Comment: wine, socially    Drug use: No   Sexual activity: Yes    Birth control/protection: Surgical    Comment: hyst  Other Topics Concern   Not on file  Social History Narrative   Not on file   Social Drivers of Health   Financial Resource Strain: Low Risk  (10/06/2023)   Overall Financial Resource Strain (CARDIA)    Difficulty of Paying  Living Expenses: Not hard at all  Food Insecurity: No Food Insecurity (10/06/2023)   Hunger Vital Sign    Worried About Running Out of Food in the Last Year: Never true    Ran Out of Food in the Last Year: Never true  Transportation Needs: No Transportation Needs (10/06/2023)   PRAPARE - Administrator, Civil Service (Medical): No    Lack of Transportation (Non-Medical): No  Physical Activity: Sufficiently Active (10/06/2023)   Exercise Vital Sign    Days of Exercise per Week: 3 days    Minutes of Exercise per Session: 60 min  Stress: Stress Concern Present (10/06/2023)   [employer] of Occupational Health - Occupational Stress Questionnaire    Feeling of Stress : To some extent  Social Connections: Socially Integrated (10/06/2023)   Social Connection and Isolation Panel [NHANES]    Frequency of Communication with Friends and Family: More than three times a week    Frequency of Social Gatherings with Friends and Family: Once a week    Attends Religious Services: More than 4 times per year    Active Member of [employer] or Organizations: Yes    Attends Banker Meetings: 1 to 4 times per year    Marital Status: Married  Review of Systems Per HPI  Objective:  BP 138/82   Pulse 71   Temp 97.7 F (36.5 C)   Ht 5\' 3"  (1.6 m)   Wt 168 lb (76.2 kg)   SpO2 98%   BMI 29.76 kg/m      04/19/2024   10:47 AM 02/18/2024    8:33 AM 01/26/2024   10:20 AM  BP/Weight  Systolic BP 138 115 124  Diastolic BP 82 73 81  Wt. (Lbs) 168 163.2 167  BMI 29.76 kg/m2 28.91 kg/m2 29.58 kg/m2    Physical Exam Constitutional:      General: She is not in acute distress.    Appearance: Normal appearance.  HENT:     Head: Normocephalic and atraumatic.  Pulmonary:     Effort: Pulmonary effort is normal. No respiratory distress.  Skin:    Comments: Resolving rash to the right side of the neck and the dorsal wrist.  Neurological:     Mental Status: She is alert.   Psychiatric:        Mood and Affect: Mood normal.        Behavior: Behavior normal.     Lab Results  Component Value Date   WBC 3.7 08/01/2023   HGB 13.8 08/01/2023   HCT 42.2 08/01/2023   PLT 220 08/01/2023   GLUCOSE 119 (H) 01/27/2024   CHOL 193 01/27/2024   TRIG 62 01/27/2024   HDL 87 01/27/2024   LDLCALC 94 01/27/2024   ALT 18 01/27/2024   AST 19 01/27/2024   NA 140 01/27/2024   K 5.0 01/27/2024   CL 102 01/27/2024   CREATININE 0.91 01/27/2024   BUN 15 01/27/2024   CO2 24 01/27/2024   TSH 2.010 04/03/2015   INR 1.05 01/24/2012   HGBA1C 5.8 (H) 01/27/2024     Assessment & Plan:  Rash Assessment & Plan: Clinically appears to be due to dermatitis secondary to poison oak or poison ivy.  Advised to complete course of corticosteroids.  Supportive care.     Follow-up:  Return if symptoms worsen or fail to improve.  Kathleen Papa DO [facility]

## 2024-04-19 NOTE — Patient Instructions (Signed)
 If worsens or fails to resolve.   Take care  Dr. Debrah Fan

## 2024-04-19 NOTE — Assessment & Plan Note (Signed)
 Clinically appears to be due to dermatitis secondary to poison oak or poison ivy.  Advised to complete course of corticosteroids.  Supportive care.

## 2024-06-30 ENCOUNTER — Ambulatory Visit: Payer: Self-pay

## 2024-06-30 NOTE — Telephone Encounter (Signed)
 FYI Only or Action Required?: FYI only for provider.  Patient was last seen in primary care on 04/19/2024 by Cook, Jayce G, DO.  Called Nurse Triage reporting Dizziness.  Symptoms began several days ago.  Interventions attempted: Rest, hydration, or home remedies.  Symptoms are: unchanged.  Triage Disposition: See Physician Within 24 Hours  Patient/caregiver understands and will follow disposition?: Yes  **Appt scheduled for 8/14**        Copied from CRM #[id number]. Topic: Clinical - Red Word Triage >> Jun 30, 2024  2:20 PM Donna BRAVO wrote: Red Word that prompted transfer to Nurse Triage: patient is experiencing dizziness, weakness light headache,  happening more often.  Feeling vervous Reason for Disposition  [1] MODERATE dizziness (e.g., interferes with normal activities) AND [2] has NOT been evaluated by doctor (or NP/PA) for this  (Exception: Dizziness caused by heat exposure, sudden standing, or poor fluid intake.)  Answer Assessment - Initial Assessment Questions 1. DESCRIPTION: Describe your dizziness.     She feels off balance  2. LIGHTHEADED: Do you feel lightheaded? (e.g., somewhat faint, woozy, weak upon standing)     Not currently during call, reports when up and moving  3. VERTIGO: Do you feel like either you or the room is spinning or tilting? (i.e., vertigo)     Some tilting   4. SEVERITY: How bad is it?  Do you feel like you are going to faint? Can you stand and walk?     Mild   5. ONSET:  When did the dizziness begin?     Several days  6. AGGRAVATING FACTORS: Does anything make it worse? (e.g., standing, change in head position)     She reports personal changes such as husband being ill, makes her anxious.   8. CAUSE: What do you think is causing the dizziness? (e.g., decreased fluids or food, diarrhea, emotional distress, heat exposure, new medicine, sudden standing, vomiting; unknown)     Unknown   9. RECURRENT SYMPTOM: Have you had  dizziness before? If Yes, ask: When was the last time? What happened that time?     Yes, several years ago   10. OTHER SYMPTOMS: Do you have any other symptoms? (e.g., fever, chest pain, vomiting, diarrhea, bleeding)  No  Protocols used: Dizziness - Lightheadedness-A-AH

## 2024-07-01 ENCOUNTER — Encounter: Payer: Self-pay | Admitting: Nurse Practitioner

## 2024-07-01 ENCOUNTER — Other Ambulatory Visit: Payer: Self-pay

## 2024-07-01 ENCOUNTER — Ambulatory Visit: Admitting: Nurse Practitioner

## 2024-07-01 VITALS — BP 140/80 | HR 66 | Temp 97.7°F | Ht 63.0 in | Wt 167.0 lb

## 2024-07-01 DIAGNOSIS — R42 Dizziness and giddiness: Secondary | ICD-10-CM

## 2024-07-01 DIAGNOSIS — K219 Gastro-esophageal reflux disease without esophagitis: Secondary | ICD-10-CM

## 2024-07-01 DIAGNOSIS — R200 Anesthesia of skin: Secondary | ICD-10-CM | POA: Diagnosis not present

## 2024-07-01 DIAGNOSIS — I10 Essential (primary) hypertension: Secondary | ICD-10-CM | POA: Diagnosis not present

## 2024-07-01 MED ORDER — PANTOPRAZOLE SODIUM 40 MG PO TBEC
40.0000 mg | DELAYED_RELEASE_TABLET | Freq: Every day | ORAL | 0 refills | Status: AC
Start: 2024-07-01 — End: ?

## 2024-07-01 NOTE — Progress Notes (Signed)
 Subjective:    Patient ID: Daisy Edwards, female    DOB: 18-Jan-1956, 68 y.o.   MRN: [id number]  HPI Discussed the use of AI scribe software for clinical note transcription with the patient, who gave verbal consent to proceed.  History of Present Illness Daisy Edwards is a 68 year old female who presents with complaints of feeling unbalanced and occasional dizziness.  She has been experiencing a sensation of being off balance, described as more off balance than dizzy, for at least a couple of months. Worse over the last month. These episodes occur several times a day and can be triggered by standing up. Notice a few minutes ago when stepping on scales. No episodes of syncope have occurred. No visual changes, loss of vision, blurred vision, spots, numbness or weakness of the arms or legs, ear pain, pressure, or popping. She reports some difficulty speaking and a sensation of difference on one side of her face. No difficulty swallowing.   She experiences a mild, annoying headache that does not require medication. There are no associated visual changes. She has a history of Bell's palsy and describes a sensation of difference, but not numbness, on one side of her face, particularly around her lip. There is no numbness or weakness in her arms or legs.  She reports intermittent chest pressure localized between her breasts, which does not radiate. Possible acid reflux or heartburn. She recently switched to a darker roast coffee, which she speculates might be contributing to her symptoms. No smoking or vaping and no alcohol consumption in the past couple of months.  She has observed ankle swelling on a few occasions, notably last week and again on Monday. She recalls consuming Asian food, which she acknowledges might have been high in sodium. No abdominal pain, diarrhea, constipation, or changes in bowel movements.  Her social history includes caring for her husband, who has had strokes, which she  notes may contribute to her stress levels. No significant changes in her ability to perform normal activities. No unusual SOB and no orthopnea.   Last cardiology visit 06/2023.   Review of Systems  Constitutional:  Positive for fatigue.  HENT:  Negative for ear pain and trouble swallowing.   Respiratory:  Negative for cough, chest tightness and shortness of breath.   Cardiovascular:  Positive for chest pain and leg swelling.       Localized chest discomfort at lower sternum unassociated with activity.   Gastrointestinal:  Negative for abdominal pain, blood in stool, constipation, diarrhea, nausea and vomiting.       Objective:   Physical Exam Vitals and nursing note reviewed.  Constitutional:      General: She is not in acute distress. HENT:     Ears:     Comments: Minimal clear effusion.     Mouth/Throat:     Mouth: Mucous membranes are moist.     Pharynx: Oropharynx is clear.  Eyes:     Extraocular Movements: Extraocular movements intact.     Pupils: Pupils are equal, round, and reactive to light.  Cardiovascular:     Rate and Rhythm: Normal rate and regular rhythm.     Heart sounds: Normal heart sounds.  Pulmonary:     Effort: Pulmonary effort is normal.     Breath sounds: Normal breath sounds.  Abdominal:     General: There is no distension.     Palpations: Abdomen is soft. There is no mass.     Tenderness: There is abdominal  tenderness. There is no guarding or rebound.     Comments: Localized upper epigastric tenderness.   Musculoskeletal:     Cervical back: Neck supple.     Right lower leg: No edema.     Left lower leg: No edema.  Lymphadenopathy:     Cervical: No cervical adenopathy.  Skin:    General: Skin is warm and dry.  Neurological:     General: No focal deficit present.     Mental Status: She is alert and oriented to person, place, and time.     Cranial Nerves: No cranial nerve deficit.     Motor: No weakness.     Coordination: Coordination normal.      Gait: Gait normal.     Deep Tendon Reflexes: Reflexes normal.     Comments: Sensation grossly intact facial area.   Psychiatric:        Mood and Affect: Mood normal.        Behavior: Behavior normal.        Thought Content: Thought content normal.        Judgment: Judgment normal.    Orthostatic Vitals for the past 48 hrs (Last 6 readings):  Orthostatic BP Orthostatic Pulse BP Pulse  07/01/24 1132 -- -- (!) 140/80 66  07/01/24 1203 133/80 61 -- --  07/01/24 1204 131/80 62 -- --  07/01/24 1205 125/80 60 -- --           Assessment & Plan:   Problem List Items Addressed This Visit       Cardiovascular and Mediastinum   Essential hypertension, benign   Other Visit Diagnoses       Vertigo    -  Primary     Left facial numbness         Gastroesophageal reflux disease without esophagitis       Relevant Medications   pantoprazole  (PROTONIX ) 40 MG tablet      Will order MRI of the brain with contrast due to symptoms and persistence.  Meds ordered this encounter  Medications   pantoprazole  (PROTONIX ) 40 MG tablet    Sig: Take 1 tablet (40 mg total) by mouth daily. Prn acid reflux    Dispense:  90 tablet    Refill:  0    Supervising Provider:   ALPHONSA HAMILTON A [[id number]]   Start Pantoprazole  as directed daily for 3-4 weeks. If symptoms have not resolved, contact office. If resolved, switch to prn dosing. Discussed lifestyle measures affecting her condition including caffeine intake, stress and diet.  Further follow up based on MRI. Discussed warning signs. Go to ED sooner if any problems.  Return for follow up in September as planned.Daisy Edwards

## 2024-07-07 ENCOUNTER — Other Ambulatory Visit: Payer: Self-pay | Admitting: Nurse Practitioner

## 2024-07-07 DIAGNOSIS — G510 Bell's palsy: Secondary | ICD-10-CM

## 2024-07-07 DIAGNOSIS — R200 Anesthesia of skin: Secondary | ICD-10-CM | POA: Insufficient documentation

## 2024-07-07 DIAGNOSIS — R42 Dizziness and giddiness: Secondary | ICD-10-CM | POA: Insufficient documentation

## 2024-07-10 ENCOUNTER — Ambulatory Visit (HOSPITAL_COMMUNITY)
Admission: RE | Admit: 2024-07-10 | Discharge: 2024-07-10 | Disposition: A | Discharge status: Home / Self Care | Source: Ambulatory Visit | Attending: Nurse Practitioner | Admitting: Nurse Practitioner

## 2024-07-10 DIAGNOSIS — R200 Anesthesia of skin: Secondary | ICD-10-CM | POA: Diagnosis not present

## 2024-07-10 DIAGNOSIS — G510 Bell's palsy: Secondary | ICD-10-CM | POA: Diagnosis present

## 2024-07-10 DIAGNOSIS — R42 Dizziness and giddiness: Secondary | ICD-10-CM

## 2024-07-10 MED ORDER — GADOBUTROL 1 MMOL/ML IV SOLN
7.5000 mL | Freq: Once | INTRAVENOUS | Status: AC | PRN
  Administered 2024-07-10: 7.5 mL via INTRAVENOUS

## 2024-07-20 ENCOUNTER — Ambulatory Visit: Payer: Self-pay | Admitting: Nurse Practitioner

## 2024-07-21 ENCOUNTER — Telehealth: Payer: Self-pay

## 2024-07-21 NOTE — Telephone Encounter (Signed)
 MRI results given. Patient is still having some symptoms and will keep f/u with Dr Bluford. Advised it any worsening symptoms to contact office or to report to UC or ED for emergent symptoms.

## 2024-08-02 ENCOUNTER — Encounter: Payer: Self-pay | Admitting: Family Medicine

## 2024-08-02 ENCOUNTER — Ambulatory Visit: Admitting: Family Medicine

## 2024-08-02 VITALS — BP 138/78 | Ht 63.0 in | Wt 166.0 lb

## 2024-08-02 DIAGNOSIS — E785 Hyperlipidemia, unspecified: Secondary | ICD-10-CM

## 2024-08-02 DIAGNOSIS — I10 Essential (primary) hypertension: Secondary | ICD-10-CM

## 2024-08-02 DIAGNOSIS — R200 Anesthesia of skin: Secondary | ICD-10-CM

## 2024-08-02 DIAGNOSIS — R7303 Prediabetes: Secondary | ICD-10-CM

## 2024-08-02 MED ORDER — ROSUVASTATIN CALCIUM 40 MG PO TABS: 40.0000 mg | ORAL_TABLET | Freq: Every day | ORAL | 3 refills | Status: DC

## 2024-08-02 NOTE — Assessment & Plan Note (Signed)
 Stable.  Continue current medications.

## 2024-08-02 NOTE — Assessment & Plan Note (Signed)
Referring to neurology. 

## 2024-08-02 NOTE — Patient Instructions (Signed)
 Labs ordered.  Arranging neurology referral.  Follow up in 3-6 months.  Take care  Dr. Bluford

## 2024-08-02 NOTE — Assessment & Plan Note (Signed)
 Would like better control. Continuing Crestor  and Zetia . Will see if we can get PCSK9 approved.

## 2024-08-02 NOTE — Assessment & Plan Note (Signed)
-

## 2024-08-02 NOTE — Progress Notes (Signed)
 Subjective:  Patient ID: Daisy Edwards, female    DOB: 09/25/1956  Age: 68 y.o. MRN: [id number]  CC:   Chief Complaint  Patient presents with   Medical Management of Chronic Issues    HPI:  68 year old female presents for follow up.  Patient reports that she is overall doing well.  However, she was recently seen for vertigo and left-sided facial numbness.  She states that she continues to have some numbness to the left side of her lips.  She also seems to have some word finding difficulty.  She had an MRI on 8/23 which did not reveal any significant abnormality.  If she is concerned about her persistent symptoms.  Blood pressure fairly well-controlled.  Most recent LDL 94.  She is on max dose Crestor  and is also on Zetia .  Would benefit from PCSK9.  Patient declines influenza vaccine today.  She had a pneumonia vaccine in 2024.   Patient Active Problem List   Diagnosis Date Noted   Left facial numbness 07/07/2024   Microvascular angina (HCC) 01/23/2023   S/P hysterectomy 09/30/2022   Prediabetes 02/14/2018   Varicose veins of bilateral lower extremities with other complications 07/19/2014   Hyperlipidemia 03/15/2013   Cervical stenosis of spinal canal 01/29/2012    Class: Diagnosis of   Essential hypertension, benign 03/20/2009    Social Hx   Social History   Socioeconomic History   Marital status: Married    Spouse name: Not on file   Number of children: 0   Years of education: Not on file   Highest education level: Not on file  Occupational History    Employer: [employer]  Tobacco Use   Smoking status: Former    Types: Cigarettes   Smokeless tobacco: Never   Tobacco comments:    quit 30 years ago.   Vaping Use   Vaping status: Never Used  Substance and Sexual Activity   Alcohol use: Not Currently    Comment: wine, socially    Drug use: No   Sexual activity: Yes    Birth control/protection: Surgical    Comment: hyst  Other Topics Concern    Not on file  Social History Narrative   Not on file   Social Drivers of Health   Financial Resource Strain: Low Risk  (10/06/2023)   Overall Financial Resource Strain (CARDIA)    Difficulty of Paying Living Expenses: Not hard at all  Food Insecurity: No Food Insecurity (10/06/2023)   Hunger Vital Sign    Worried About Running Out of Food in the Last Year: Never true    Ran Out of Food in the Last Year: Never true  Transportation Needs: No Transportation Needs (10/06/2023)   PRAPARE - Administrator, Civil Service (Medical): No    Lack of Transportation (Non-Medical): No  Physical Activity: Sufficiently Active (10/06/2023)   Exercise Vital Sign    Days of Exercise per Week: 3 days    Minutes of Exercise per Session: 60 min  Stress: Stress Concern Present (10/06/2023)   [employer] of Occupational Health - Occupational Stress Questionnaire    Feeling of Stress : To some extent  Social Connections: Socially Integrated (10/06/2023)   Social Connection and Isolation Panel    Frequency of Communication with Friends and Family: More than three times a week    Frequency of Social Gatherings with Friends and Family: Once a week    Attends Religious Services: More than 4 times per year  Active Member of Clubs or Organizations: Yes    Attends Banker Meetings: 1 to 4 times per year    Marital Status: Married    Review of Systems Per HPI  Objective:  BP 138/78   Ht 5' 3 (1.6 m)   Wt 166 lb (75.3 kg)   BMI 29.41 kg/m      08/02/2024   11:05 AM 07/01/2024   11:32 AM 04/19/2024   10:47 AM  BP/Weight  Systolic BP 138 140 138  Diastolic BP 78 80 82  Wt. (Lbs) 166 167 168  BMI 29.41 kg/m2 29.58 kg/m2 29.76 kg/m2    Physical Exam Vitals and nursing note reviewed.  Constitutional:      General: She is not in acute distress.    Appearance: Normal appearance.  HENT:     Head: Normocephalic and atraumatic.  Eyes:     General:        Right eye: No  discharge.        Left eye: No discharge.     Conjunctiva/sclera: Conjunctivae normal.  Cardiovascular:     Rate and Rhythm: Normal rate and regular rhythm.  Pulmonary:     Effort: Pulmonary effort is normal.     Breath sounds: Normal breath sounds. No wheezing, rhonchi or rales.  Neurological:     Mental Status: She is alert.  Psychiatric:        Mood and Affect: Mood normal.        Behavior: Behavior normal.     Lab Results  Component Value Date   WBC 3.7 08/01/2023   HGB 13.8 08/01/2023   HCT 42.2 08/01/2023   PLT 220 08/01/2023   GLUCOSE 119 (H) 01/27/2024   CHOL 193 01/27/2024   TRIG 62 01/27/2024   HDL 87 01/27/2024   LDLCALC 94 01/27/2024   ALT 18 01/27/2024   AST 19 01/27/2024   NA 140 01/27/2024   K 5.0 01/27/2024   CL 102 01/27/2024   CREATININE 0.91 01/27/2024   BUN 15 01/27/2024   CO2 24 01/27/2024   TSH 2.010 04/03/2015   INR 1.05 01/24/2012   HGBA1C 5.8 (H) 01/27/2024     Assessment & Plan:  Essential hypertension, benign Assessment & Plan: Stable. Continue current medications.   Hyperlipidemia, unspecified hyperlipidemia type Assessment & Plan: Would like better control. Continuing Crestor  and Zetia . Will see if we can get PCSK9 approved.   Orders: -     Lipid panel -     Rosuvastatin  Calcium ; Take 1 tablet (40 mg total) by mouth daily.  Dispense: 90 tablet; Refill: 3  Prediabetes Assessment & Plan: A1c today.  Orders: -     CMP14+EGFR -     Hemoglobin A1c  Left facial numbness Assessment & Plan: Referring to neurology.  Orders: -     Ambulatory referral to Neurology    Follow-up:  6 months  Manasa Spease Bluford DO [facility]

## 2024-08-03 ENCOUNTER — Encounter: Payer: Self-pay | Admitting: Neurology

## 2024-08-12 LAB — LIPID PANEL
Chol/HDL Ratio: 2 ratio (ref 0.0–4.4)
Cholesterol, Total: 156 mg/dL (ref 100–199)
HDL: 78 mg/dL (ref >39)
LDL Chol Calc (NIH): 67 mg/dL (ref 0–99)
Triglycerides: 53 mg/dL (ref 0–149)
VLDL Cholesterol Cal: 11 mg/dL (ref 5–40)

## 2024-08-12 LAB — CMP14+EGFR
ALT: 23 IU/L (ref 0–32)
AST: 23 IU/L (ref 0–40)
Albumin: 4.4 g/dL (ref 3.9–4.9)
Alkaline Phosphatase: 54 IU/L (ref 49–135)
BUN/Creatinine Ratio: 16 (ref 12–28)
BUN: 16 mg/dL (ref 8–27)
Bilirubin Total: 0.6 mg/dL (ref 0.0–1.2)
CO2: 24 mmol/L (ref 20–29)
Calcium: 9.5 mg/dL (ref 8.7–10.3)
Chloride: 104 mmol/L (ref 96–106)
Creatinine, Ser: 1 mg/dL (ref 0.57–1.00)
Globulin, Total: 2 g/dL (ref 1.5–4.5)
Glucose: 126 mg/dL — ABNORMAL HIGH (ref 70–99)
Potassium: 4.3 mmol/L (ref 3.5–5.2)
Sodium: 141 mmol/L (ref 134–144)
Total Protein: 6.4 g/dL (ref 6.0–8.5)
eGFR: 62 mL/min/1.73 (ref >59)

## 2024-08-12 LAB — HEMOGLOBIN A1C
Est. average glucose Bld gHb Est-mCnc: 134 mg/dL
Hgb A1c MFr Bld: 6.3 % — ABNORMAL HIGH (ref 4.8–5.6)

## 2024-08-15 ENCOUNTER — Ambulatory Visit: Payer: Self-pay | Admitting: Family Medicine

## 2024-08-17 NOTE — Telephone Encounter (Signed)
-----   Message from Jacqulyn KANDICE Ahle sent at 08/15/2024  4:00 PM EDT ----- A1c has risen but labs are still at goal. Watch diet. Regular exercise. ----- Message ----- From: Rebecka Memos Lab Results In Sent: 08/12/2024   5:39 AM EDT To: Jayce G Cook, DO

## 2024-08-17 NOTE — Telephone Encounter (Signed)
 Patient aware of results and recommendations.

## 2024-08-28 ENCOUNTER — Other Ambulatory Visit: Payer: Self-pay | Admitting: Cardiology

## 2024-08-30 ENCOUNTER — Other Ambulatory Visit (HOSPITAL_COMMUNITY): Payer: Self-pay | Admitting: Adult Health

## 2024-08-30 DIAGNOSIS — Z1231 Encounter for screening mammogram for malignant neoplasm of breast: Secondary | ICD-10-CM

## 2024-09-01 ENCOUNTER — Ambulatory Visit (HOSPITAL_COMMUNITY)
Admission: RE | Admit: 2024-09-01 | Discharge: 2024-09-01 | Disposition: A | Discharge status: Home / Self Care | Source: Ambulatory Visit | Attending: Adult Health | Admitting: Adult Health

## 2024-09-01 ENCOUNTER — Encounter (HOSPITAL_COMMUNITY): Payer: Self-pay

## 2024-09-01 DIAGNOSIS — Z1231 Encounter for screening mammogram for malignant neoplasm of breast: Secondary | ICD-10-CM | POA: Diagnosis present

## 2024-09-03 ENCOUNTER — Ambulatory Visit: Payer: Self-pay | Admitting: Adult Health

## 2024-09-06 NOTE — Telephone Encounter (Signed)
 Left message @ 8:31 am, letting pt know her mammogram was negative. Recommend another screening mammogram in 1 year. JSY

## 2024-09-06 NOTE — Telephone Encounter (Signed)
-----   Message from Delon Lewis sent at 09/03/2024  1:37 PM EDT ----- Let her know mammogram was negative

## 2024-09-09 ENCOUNTER — Encounter: Payer: Self-pay | Admitting: Cardiology

## 2024-09-09 ENCOUNTER — Ambulatory Visit: Attending: Cardiology | Admitting: Cardiology

## 2024-09-09 VITALS — BP 132/70 | HR 68 | Ht 63.0 in | Wt 167.6 lb

## 2024-09-09 DIAGNOSIS — I2089 Other forms of angina pectoris: Secondary | ICD-10-CM | POA: Diagnosis present

## 2024-09-09 DIAGNOSIS — E782 Mixed hyperlipidemia: Secondary | ICD-10-CM | POA: Diagnosis present

## 2024-09-09 DIAGNOSIS — I10 Essential (primary) hypertension: Secondary | ICD-10-CM | POA: Diagnosis present

## 2024-09-09 NOTE — Progress Notes (Signed)
    Cardiology Office Note  Date: 09/09/2024   ID: Julyssa, Kyer [birth year], MRN [id number]  History of Present Illness: Daisy Edwards is a 68 y.o. female last seen in August 2024.  She is here for a follow-up visit.  Reports no increasing pattern of angina or specific change in stamina.  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  Not exercising as much.  She does not report any palpitations.  Has had intermittent ankle edema which I suspect is likely related to Norvasc .  No orthopnea or PND.  We went over her medications.  She reports compliance with therapy.  Recent lipid panel showed LDL 67 and HDL 78.  I rechecked her blood pressure today at 132/70.  I reviewed her ECG today which shows normal sinus rhythm.  Physical Exam: VS:  BP 132/70 (BP Location: Right Arm)   Pulse 68   Ht 5' 3 (1.6 m)   Wt 167 lb 9.6 oz (76 kg)   SpO2 98%   BMI 29.69 kg/m , BMI Body mass index is 29.69 kg/m.  Wt Readings from Last 3 Encounters:  09/09/24 167 lb 9.6 oz (76 kg)  08/02/24 166 lb (75.3 kg)  07/01/24 167 lb (75.8 kg)    General: Patient appears comfortable at rest. HEENT: Conjunctiva and lids normal. Neck: Supple, no elevated JVP or carotid bruits. Lungs: Clear to auscultation, nonlabored breathing at rest. Cardiac: Regular rate and rhythm, no S3 or significant systolic murmur. Extremities: No pitting edema.  ECG:  An ECG dated 07/17/2023 was personally reviewed today and demonstrated:  Sinus rhythm.  Labwork: 08/11/2024: ALT 23; AST 23; BUN 16; Creatinine, Ser 1.00; Potassium 4.3; Sodium 141     Component Value Date/Time   CHOL 156 08/11/2024 0856   TRIG 53 08/11/2024 0856   HDL 78 08/11/2024 0856   CHOLHDL 2.0 08/11/2024 0856   CHOLHDL 2.7 01/30/2015 1429   VLDL 15 01/30/2015 1429   LDLCALC 67 08/11/2024 0856   Other Studies Reviewed Today:  No interval cardiac testing for review today.  Assessment and  Plan:  1.  Microvascular angina.  No increasing pattern.  ECG today is normal.  Recommended getting back to regular exercise plan.  She continues on medical therapy including Norvasc  5 mg daily, Ranexa  500 mg twice daily, Crestor  40 mg daily, and Zetia  10 mg daily.   2.  Mixed hyperlipidemia.  LDL 67 in September.  Continue Crestor  40 mg daily and Zetia  10 mg daily.   3.  Primary hypertension.  No changes made to current regimen which also includes Zestoretic  20/25 mg daily.  Disposition:  Follow up 1 year.  Signed, Jayson JUDITHANN Sierras, M.D., F.A.C.C. [facility]

## 2024-09-09 NOTE — Patient Instructions (Signed)
 Medication Instructions:  Your physician recommends that you continue on your current medications as directed. Please refer to the Current Medication list given to you today.   Labwork: None today  Testing/Procedures: None today  Follow-Up: 1 year  Any Other Special Instructions Will Be Listed Below (If Applicable).  If you need a refill on your cardiac medications before your next appointment, please call your pharmacy.

## 2024-10-18 ENCOUNTER — Encounter: Payer: Self-pay | Admitting: Neurology

## 2024-10-18 ENCOUNTER — Ambulatory Visit (INDEPENDENT_AMBULATORY_CARE_PROVIDER_SITE_OTHER): Admitting: Neurology

## 2024-10-18 VITALS — BP 127/76 | HR 76 | Ht 63.0 in | Wt 168.0 lb

## 2024-10-18 DIAGNOSIS — R200 Anesthesia of skin: Secondary | ICD-10-CM | POA: Diagnosis not present

## 2024-10-18 NOTE — Progress Notes (Signed)
 [facility] Clinic Note - Initial Visit   Date: 10/18/2024   Daisy Edwards MRN: [id number] DOB: 1956/04/13   Dear Dr. Bluford:  Thank you for your kind referral of Daisy Edwards for consultation of facial pain. Although her history is well known to you, please allow us  to reiterate it for the purpose of our medical record. The patient was accompanied to the clinic by self.  Daisy Edwards is a 68 y.o. right-handed female with hypertension, hyperlipidemia, history of left Bell's palsy (no residual deficits) and GERD presenting for evaluation of left facial numbness.   IMPRESSION/PLAN: Assessment & Plan Left facial numbness, resolved.  Etiology unclear.  Symptoms are not suggestive of stroke or Bell's palsy.  MRI brain was personally viewed and does not show structural abnormality to explain symptoms.   Neurological exam is normal.  - Reassurance was provided.  - No further testing needed - If symptoms return, check vitamin B12, folate, TSH  ------------------------------------------------------------- History of present illness:  Discussed the use of AI scribe software for clinical note transcription with the patient, who gave verbal consent to proceed.  History of Present Illness Daisy Edwards is a 68 year old female who presents with recent numbness on the left side of her face.  Approximately 2-3 months ago, she experienced transient numbness on the left side of her face, particularly around the lips. Although the numbness did not last long, she still notices a slight difference. She is concerned due to her past history of Bell's palsy on the same side over twenty years ago.  During the episode of numbness, she felt off balance for several weeks but did not experience dizziness or weakness. She sometimes feels her words do not come out properly.  MRI brain from 07/2024 was normal.   She has been under stress since July, caring for her husband who had a  stroke.   She reports swelling and tenderness in her foot and has an upcoming appointment with her foot doctor to address this issue.    Out-side paper records, electronic medical record, and images have been reviewed where available and summarized as:  MRI brain wwo contrast 07/20/2024:  No significant abnormality  Lab Results  Component Value Date   HGBA1C 6.3 (H) 08/11/2024   Lab Results  Component Value Date   VITAMINB12 678 02/13/2018   Lab Results  Component Value Date   TSH 2.010 04/03/2015   No results found for: ESRSEDRATE, POCTSEDRATE  Past Medical History:  Diagnosis Date   Anemia    Anxiety    Panic attack in past   Arthritis    Cervical stenosis, spondylosis    Back pain, chronic    Carpal tunnel syndrome on both sides    Right > left   Cervical vertebral fusion    Chronic leg pain    Elevated cholesterol    Essential hypertension    Hyperlipidemia    Impaired fasting glucose    Microvascular angina     Past Surgical History:  Procedure Laterality Date   ABDOMINAL HYSTERECTOMY     ANTERIOR CERVICAL DECOMP/DISCECTOMY FUSION  01/29/2012   Procedure: ANTERIOR CERVICAL DECOMPRESSION/DISCECTOMY FUSION 2 LEVELS;  Surgeon: Oneil JAYSON Herald, MD;  Location: [facility] OR;  Service: Orthopedics;  Laterality: N/A;  C3-4, C4-5 Anterior Cervical Discectomy and Fusion, allograft, plate   CARDIAC CATHETERIZATION     prior to 2008, not sure where it was done, result- wnl    CHOLECYSTECTOMY  [facility]   COLONOSCOPY     COLONOSCOPY N/A 10/06/2017   Procedure: COLONOSCOPY;  Surgeon: Harvey Margo CROME, MD;  Location: [facility];  Service: Endoscopy;  Laterality: N/A;  8:30   COLONOSCOPY WITH PROPOFOL  N/A 10/14/2023   Procedure: COLONOSCOPY WITH PROPOFOL ;  Surgeon: Cindie Carlin POUR, DO;  Location: [facility];  Service: Endoscopy;  Laterality: N/A;  930am, asa 2   POLYPECTOMY  10/06/2017   Procedure: POLYPECTOMY;  Surgeon: Harvey Margo CROME, MD;  Location: [facility];   Service: Endoscopy;;  colon   POLYPECTOMY  10/14/2023   Procedure: POLYPECTOMY INTESTINAL;  Surgeon: Cindie Carlin POUR, DO;  Location: [facility];  Service: Endoscopy;;   VEIN SURGERY       Medications:  Outpatient Encounter Medications as of 10/18/2024  Medication Sig   amLODipine  (NORVASC ) 5 MG tablet Take 1 tablet (5 mg total) by mouth at bedtime.   acyclovir (ZOVIRAX) 200 MG capsule Take 200 mg by mouth 3 (three) times daily. (Patient not taking: Reported on 10/18/2024)   aspirin 81 MG tablet Take 81 mg by mouth every other day.   ezetimibe  (ZETIA ) 10 MG tablet Take 1 tablet (10 mg total) by mouth daily.   lisinopril -hydrochlorothiazide  (ZESTORETIC ) 20-25 MG tablet Take 1 tablet by mouth daily.   Multiple Vitamin (MULITIVITAMIN WITH MINERALS) TABS Take 1 tablet by mouth daily.   Omega-3 Fatty Acids (FISH OIL TRIPLE STRENGTH PO) Take 1,500 mg by mouth daily.    pantoprazole  (PROTONIX ) 40 MG tablet Take 1 tablet (40 mg total) by mouth daily. Prn acid reflux   ranolazine  (RANEXA ) 500 MG 12 hr tablet Take 1 tablet by mouth twice daily   rosuvastatin  (CRESTOR ) 40 MG tablet Take 1 tablet (40 mg total) by mouth daily.   TURMERIC PO Take 1 capsule by mouth daily as needed (joint pain).   No facility-administered encounter medications on file as of 10/18/2024.    Allergies:  Allergies  Allergen Reactions   Vitamin C Itching    Cream: neck started burning    Family History: Family History  Problem Relation Age of Onset   Hypertension Mother    Aneurysm Mother        brain   Heart disease Father        MI   Heart attack Father    Breast cancer Maternal Aunt    Cancer Maternal Aunt        breast   Diabetes Maternal Aunt    Diabetes Maternal Aunt    Cancer Maternal Grandmother        colon    SIDS Son    Anesthesia problems Neg Hx     Social History: Social History   Tobacco Use   Smoking status: Former    Types: Cigarettes   Smokeless tobacco: Never   Tobacco  comments:    quit 30 years ago.   Vaping Use   Vaping status: Never Used  Substance Use Topics   Alcohol use: Not Currently    Comment: wine, socially    Drug use: No   Social History   Social History Narrative   Are you right handed or left handed? Right   Are you currently employed ?    What is your current occupation? Retired/    Do you live at home alone?   Who lives with you? husband   What type of home do you live in: 1 story or 2 story? Two    Caffiene 1 large  cup daily        Vital Signs:  BP 127/76   Pulse 76   Ht 5' 3 (1.6 m)   Wt 168 lb (76.2 kg)   SpO2 98%   BMI 29.76 kg/m    Neurological Exam: MENTAL STATUS including orientation to time, place, person, recent and remote memory, attention span and concentration, language, and fund of knowledge is normal.  Speech is not dysarthric.  CRANIAL NERVES: II:  No visual field defects.     III-IV-VI: Pupils equal round and reactive to light.  Normal conjugate, extra-ocular eye movements in all directions of gaze.  No nystagmus.  No ptosis.   V:  Normal facial sensation.    VII:  Normal facial symmetry and movements.  Oribicularis oculi, buccinator, frontalis,and orbicularis oris is normal. VIII:  Normal hearing and vestibular function.   IX-X:  Normal palatal movement.   XI:  Normal shoulder shrug and head rotation.   XII:  Normal tongue strength and range of motion, no deviation or fasciculation.  MOTOR: Motor strength is 5/5 throughout.  No atrophy, fasciculations or abnormal movements.  No pronator drift.  MSRs:                                           Right        Left brachioradialis 2+  2+  biceps 2+  2+  triceps 2+  2+  patellar 2+  2+  ankle jerk 2+  2+  plantar response down  down   SENSORY:  Normal and symmetric perception of light touch, pinprick, vibration, and proprioception.    COORDINATION/GAIT: Normal finger-to- nose-finger.  Intact rapid alternating movements bilaterally.  Able to rise  from a chair without using arms.  Gait narrow based and stable.    Thank you for allowing me to participate in patient's care.  If I can answer any additional questions, I would be pleased to do so.    Sincerely,    Jezabella Schriever K. Tobie, DO

## 2024-10-29 ENCOUNTER — Ambulatory Visit: Payer: Self-pay

## 2024-10-29 NOTE — Telephone Encounter (Signed)
 FYI Only or Action Required?: FYI only for provider: Patient going to UC.  Patient was last seen in primary care on 08/02/2024 by Cook, Jayce G, DO.  Called Nurse Triage reporting Facial Swelling.  Symptoms began today.  Interventions attempted: Nothing.  Symptoms are: stable.  Triage Disposition: See PCP When Office is Open (Within 3 Days)  Patient/caregiver understands and will follow disposition?: Yes Reason for Disposition  [1] MILD face swelling (e.g., puffiness) AND [2] persists > 3 days  Answer Assessment - Initial Assessment Questions Patient saw Neuro 12/1 for left sided facial numbness, patient reports slight numbness still.  Denies SOB or chest pain. No available appointments in PCP office today, offered to schedule at [facility] this afternoon, patient declined and asked whether going to the ED or UC would be better. I advised patient if she would like to be seen sooner than this afternoon, she can go to UC. Patient agreeable.  1. ONSET: When did the swelling start? (e.g., minutes, hours, days)     This morning  2. LOCATION: What part of the face is swollen? (e.g., cheek, entire face, jaw joint area, under jaw)     Left side, near ear  3. SEVERITY: How swollen is it?     Stated it looks like theres an egg sitting near the ear  4. PAIN: Is the swelling painful to touch? If Yes, ask: How painful is it?   (Scale 0-10; mild, moderate or severe)     1/10, mild discomfort  5. FEVER: Do you have a fever? If Yes, ask: What is it, how was it measured, and when did it start?      Denies  6. CAUSE: What do you think is causing the face swelling?     Unsure, had a teeth cleaning at the dentist on Wednesday, x-ray device scratched jaw and felt irritated  8. NEW MEDICINES: Have there been any new medicines started recently?     Prednisone 10mg  , started 10/26/24.   9. RECURRENT SYMPTOM: Have you had face swelling before? If Yes, ask: When was the last time? What  happened that time?     Denies  10. OTHER SYMPTOMS: Do you have any other symptoms? (e.g., leg swelling, toothache)       Denies  Protocols used: Face Swelling-A-AH  Copied from CRM [id number]. Topic: Clinical - Red Word Triage >> Oct 29, 2024  9:08 AM Treva T wrote: [facility] that prompted transfer to Nurse Triage: Pt calling reports left side facial swelling, new onset, with some soreness.   States had a teeth cleaning earlier this week, and since then has been having a sensation in jaw.  Pt requesting an appt for evaluation.   Ph. [phone]

## 2024-11-26 ENCOUNTER — Other Ambulatory Visit: Payer: Self-pay

## 2024-11-26 ENCOUNTER — Telehealth: Payer: Self-pay | Admitting: Family Medicine

## 2024-11-26 DIAGNOSIS — I10 Essential (primary) hypertension: Secondary | ICD-10-CM

## 2024-11-26 DIAGNOSIS — E785 Hyperlipidemia, unspecified: Secondary | ICD-10-CM

## 2024-11-26 MED ORDER — AMLODIPINE BESYLATE 5 MG PO TABS: 5.0000 mg | ORAL_TABLET | Freq: Every day | ORAL | 3 refills | Status: AC

## 2024-11-26 MED ORDER — ROSUVASTATIN CALCIUM 40 MG PO TABS: 40.0000 mg | ORAL_TABLET | Freq: Every day | ORAL | 3 refills | Status: AC

## 2024-11-26 MED ORDER — ROSUVASTATIN CALCIUM 40 MG PO TABS: 40.0000 mg | ORAL_TABLET | Freq: Every day | ORAL | 3 refills | Status: DC

## 2024-11-26 MED ORDER — LISINOPRIL-HYDROCHLOROTHIAZIDE 20-25 MG PO TABS: 1.0000 | ORAL_TABLET | Freq: Every day | ORAL | 3 refills | Status: AC

## 2024-11-26 MED ORDER — LISINOPRIL-HYDROCHLOROTHIAZIDE 20-25 MG PO TABS: 1.0000 | ORAL_TABLET | Freq: Every day | ORAL | 3 refills | Status: DC

## 2024-11-26 MED ORDER — AMLODIPINE BESYLATE 5 MG PO TABS: 5.0000 mg | ORAL_TABLET | Freq: Every day | ORAL | 3 refills | Status: DC

## 2024-11-26 NOTE — Telephone Encounter (Signed)
 Called patient wanting to know what pharmacy to use, because [facility] is not listed as her pharmacy

## 2024-11-26 NOTE — Telephone Encounter (Signed)
 Refill  rosuvastatin  (CRESTOR ) 40 MG tablet   lisinopril -hydrochlorothiazide  (ZESTORETIC ) 20-25 MG table   amLODipine  (NORVASC ) 5 MG tablet   [facility]

## 2024-11-26 NOTE — Telephone Encounter (Signed)
 PT prescription was sent into [facility]

## 2024-11-26 NOTE — Telephone Encounter (Signed)
 Patient returned call regarding pharmacy. I did notice meds were sent to the right pharmacy.

## 2024-11-29 ENCOUNTER — Other Ambulatory Visit: Payer: Self-pay

## 2024-11-29 ENCOUNTER — Telehealth: Payer: Self-pay | Admitting: Family Medicine

## 2024-11-29 ENCOUNTER — Other Ambulatory Visit: Payer: Self-pay | Admitting: Cardiology

## 2024-11-29 MED ORDER — EZETIMIBE 10 MG PO TABS: 10.0000 mg | ORAL_TABLET | Freq: Every day | ORAL | 3 refills | Status: AC

## 2024-11-29 MED ORDER — RANOLAZINE ER 500 MG PO TB12: 500.0000 mg | ORAL_TABLET | Freq: Two times a day (BID) | ORAL | 0 refills | Status: DC

## 2024-11-29 NOTE — Telephone Encounter (Signed)
 Refill   ezetimibe  (ZETIA ) 10 MG tablet   [facility]

## 2024-11-30 ENCOUNTER — Other Ambulatory Visit: Payer: Self-pay | Admitting: Cardiology

## 2024-11-30 MED ORDER — RANOLAZINE ER 500 MG PO TB12: 500.0000 mg | ORAL_TABLET | Freq: Two times a day (BID) | ORAL | 0 refills | Status: AC

## 2024-12-08 ENCOUNTER — Ambulatory Visit: Admitting: Physician Assistant

## 2024-12-08 ENCOUNTER — Encounter: Payer: Self-pay | Admitting: Physician Assistant

## 2024-12-08 VITALS — BP 137/78 | HR 87 | Temp 97.7°F | Ht 66.0 in | Wt 174.6 lb

## 2024-12-08 DIAGNOSIS — J069 Acute upper respiratory infection, unspecified: Secondary | ICD-10-CM | POA: Diagnosis not present

## 2024-12-08 NOTE — Progress Notes (Signed)
 "  Acute Office Visit  Subjective:     Patient ID: Daisy Edwards, female    DOB: 07-Sep-1956, 69 y.o.   MRN: [id number]   Discussed the use of AI scribe software for clinical note transcription with the patient, who gave verbal consent to proceed.  History of Present Illness Daisy Edwards is a 69 year old female who presents with cough and congestion.  She began experiencing cold-like symptoms on Friday or Saturday, including congestion and coughing. By late Monday, she started expectorating a significant amount of darker mucus which concerned her. No fever, shortness of breath, or wheezing. Her appetite remains good, and she is staying hydrated. She has been taking Coricidin, which seems to help, but she continues to cough up mucus. She has been trying to avoid contact with her husband to prevent him from getting sick. She reports itchy and achy ears.    Review of Systems  Constitutional:  Positive for fatigue. Negative for activity change, appetite change and fever.  HENT:  Positive for congestion, ear pain and postnasal drip. Negative for sore throat and trouble swallowing.   Respiratory:  Positive for cough. Negative for shortness of breath.   Cardiovascular:  Negative for chest pain.  Neurological:  Negative for light-headedness and headaches.       Objective:     BP 137/78   Pulse 87   Temp 97.7 F (36.5 C)   Ht 5' 6 (1.676 m)   Wt 174 lb 9.6 oz (79.2 kg)   SpO2 97%   BMI 28.18 kg/m   Physical Exam Vitals reviewed.  Constitutional:      General: She is not in acute distress.    Appearance: Normal appearance. She is not ill-appearing.  HENT:     Head: Normocephalic and atraumatic.     Right Ear: Tympanic membrane normal.     Left Ear: Tympanic membrane normal.     Nose: Congestion present.     Mouth/Throat:     Mouth: Mucous membranes are moist.     Pharynx: Oropharynx is clear. No posterior oropharyngeal erythema.  Eyes:     Extraocular Movements:  Extraocular movements intact.     Conjunctiva/sclera: Conjunctivae normal.  Cardiovascular:     Rate and Rhythm: Normal rate and regular rhythm.     Heart sounds: No murmur heard. Pulmonary:     Effort: Pulmonary effort is normal.     Breath sounds: Normal breath sounds. No wheezing, rhonchi or rales.  Lymphadenopathy:     Cervical: No cervical adenopathy.  Skin:    General: Skin is warm and dry.  Neurological:     General: No focal deficit present.     Mental Status: She is alert and oriented to person, place, and time.  Psychiatric:        Mood and Affect: Mood normal.        Behavior: Behavior normal.     No results found for any visits on 12/08/24.      Assessment & Plan:  Viral URI Assessment & Plan: Patient appears stable today. Benign exam, lungs clear to auscultation bilaterally. Likely self-resolving viral infection. Supportive care reviewed with patient. Discussed with patient that there are no indications for antibiotics at this time, and viral respiratory illness can be persistent in duration.Tylenol  or ibuprofen for pain or fever as needed. I advised Flonase  for nasal congestion and ear pressure. May continue with OTC cold medications, Mucinex samples provided today. Patient instructed to return to clinic if worsening  shortness of breath, chest pain, hypoxia, or other concerns. Patient agreeable to plan.       Return if symptoms worsen or fail to improve.  Wolfgang Finigan, PA-C  "

## 2024-12-08 NOTE — Assessment & Plan Note (Addendum)
 Patient appears stable today. Benign exam, lungs clear to auscultation bilaterally. Likely self-resolving viral infection. Supportive care reviewed with patient. Discussed with patient that there are no indications for antibiotics at this time, and viral respiratory illness can be persistent in duration.Tylenol  or ibuprofen for pain or fever as needed. I advised Flonase  for nasal congestion and ear pressure. May continue with OTC cold medications, Mucinex samples provided today. Patient instructed to return to clinic if worsening shortness of breath, chest pain, hypoxia, or other concerns. Patient agreeable to plan.

## 2025-01-31 ENCOUNTER — Ambulatory Visit: Admitting: Family Medicine
